# Patient Record
Sex: Female | Born: 1940 | Race: White | Hispanic: No | Smoking: Never smoker
Health system: Southern US, Community
[De-identification: ages and names within clinical notes are randomized; demographics above are authoritative.]

## PROBLEM LIST (undated history)

## (undated) DIAGNOSIS — I340 Nonrheumatic mitral (valve) insufficiency: Secondary | ICD-10-CM

## (undated) DIAGNOSIS — I471 Supraventricular tachycardia, unspecified: Secondary | ICD-10-CM

## (undated) DIAGNOSIS — I10 Essential (primary) hypertension: Secondary | ICD-10-CM

## (undated) DIAGNOSIS — I779 Disorder of arteries and arterioles, unspecified: Secondary | ICD-10-CM

## (undated) DIAGNOSIS — I219 Acute myocardial infarction, unspecified: Secondary | ICD-10-CM

## (undated) DIAGNOSIS — E785 Hyperlipidemia, unspecified: Secondary | ICD-10-CM

## (undated) DIAGNOSIS — C50912 Malignant neoplasm of unspecified site of left female breast: Secondary | ICD-10-CM

## (undated) DIAGNOSIS — I4819 Other persistent atrial fibrillation: Secondary | ICD-10-CM

## (undated) DIAGNOSIS — E119 Type 2 diabetes mellitus without complications: Secondary | ICD-10-CM

## (undated) DIAGNOSIS — I2510 Atherosclerotic heart disease of native coronary artery without angina pectoris: Secondary | ICD-10-CM

## (undated) DIAGNOSIS — J189 Pneumonia, unspecified organism: Secondary | ICD-10-CM

## (undated) DIAGNOSIS — I739 Peripheral vascular disease, unspecified: Secondary | ICD-10-CM

## (undated) HISTORY — DX: Peripheral vascular disease, unspecified: I73.9

## (undated) HISTORY — DX: Disorder of arteries and arterioles, unspecified: I77.9

## (undated) HISTORY — DX: Nonrheumatic mitral (valve) insufficiency: I34.0

## (undated) HISTORY — DX: Other persistent atrial fibrillation: I48.19

## (undated) HISTORY — PX: APPENDECTOMY: SHX54

## (undated) HISTORY — PX: DILATION AND CURETTAGE OF UTERUS: SHX78

---

## 1968-08-10 HISTORY — PX: TUBAL LIGATION: SHX77

## 1996-08-10 HISTORY — PX: ABDOMINAL HYSTERECTOMY: SHX81

## 2002-08-10 DIAGNOSIS — C50912 Malignant neoplasm of unspecified site of left female breast: Secondary | ICD-10-CM

## 2002-08-10 HISTORY — PX: BREAST LUMPECTOMY: SHX2

## 2002-08-10 HISTORY — DX: Malignant neoplasm of unspecified site of left female breast: C50.912

## 2002-08-10 HISTORY — PX: BREAST BIOPSY: SHX20

## 2007-08-11 HISTORY — PX: CORONARY ANGIOPLASTY WITH STENT PLACEMENT: SHX49

## 2015-08-06 ENCOUNTER — Encounter (HOSPITAL_COMMUNITY): Payer: Self-pay | Admitting: Cardiology

## 2015-08-06 ENCOUNTER — Inpatient Hospital Stay (HOSPITAL_COMMUNITY)
Admission: AD | Admit: 2015-08-06 | Discharge: 2015-08-08 | DRG: 247 | Disposition: A | Discharge status: Home / Self Care | Payer: Medicare (Managed Care) | Source: Other Acute Inpatient Hospital | Attending: Cardiology | Admitting: Cardiology

## 2015-08-06 DIAGNOSIS — I4891 Unspecified atrial fibrillation: Secondary | ICD-10-CM

## 2015-08-06 DIAGNOSIS — I471 Supraventricular tachycardia, unspecified: Secondary | ICD-10-CM | POA: Diagnosis present

## 2015-08-06 DIAGNOSIS — N189 Chronic kidney disease, unspecified: Secondary | ICD-10-CM | POA: Diagnosis present

## 2015-08-06 DIAGNOSIS — I2510 Atherosclerotic heart disease of native coronary artery without angina pectoris: Secondary | ICD-10-CM | POA: Diagnosis not present

## 2015-08-06 DIAGNOSIS — I10 Essential (primary) hypertension: Secondary | ICD-10-CM | POA: Diagnosis not present

## 2015-08-06 DIAGNOSIS — Z955 Presence of coronary angioplasty implant and graft: Secondary | ICD-10-CM

## 2015-08-06 DIAGNOSIS — E785 Hyperlipidemia, unspecified: Secondary | ICD-10-CM | POA: Diagnosis present

## 2015-08-06 DIAGNOSIS — I25119 Atherosclerotic heart disease of native coronary artery with unspecified angina pectoris: Secondary | ICD-10-CM | POA: Diagnosis present

## 2015-08-06 DIAGNOSIS — R079 Chest pain, unspecified: Secondary | ICD-10-CM

## 2015-08-06 DIAGNOSIS — I25111 Atherosclerotic heart disease of native coronary artery with angina pectoris with documented spasm: Secondary | ICD-10-CM | POA: Diagnosis not present

## 2015-08-06 DIAGNOSIS — Z7982 Long term (current) use of aspirin: Secondary | ICD-10-CM | POA: Diagnosis not present

## 2015-08-06 DIAGNOSIS — R7989 Other specified abnormal findings of blood chemistry: Secondary | ICD-10-CM

## 2015-08-06 DIAGNOSIS — E119 Type 2 diabetes mellitus without complications: Secondary | ICD-10-CM

## 2015-08-06 DIAGNOSIS — I480 Paroxysmal atrial fibrillation: Secondary | ICD-10-CM | POA: Diagnosis present

## 2015-08-06 DIAGNOSIS — E1122 Type 2 diabetes mellitus with diabetic chronic kidney disease: Secondary | ICD-10-CM | POA: Diagnosis present

## 2015-08-06 DIAGNOSIS — R001 Bradycardia, unspecified: Secondary | ICD-10-CM | POA: Diagnosis present

## 2015-08-06 DIAGNOSIS — I214 Non-ST elevation (NSTEMI) myocardial infarction: Principal | ICD-10-CM

## 2015-08-06 DIAGNOSIS — T82855A Stenosis of coronary artery stent, initial encounter: Secondary | ICD-10-CM | POA: Diagnosis present

## 2015-08-06 DIAGNOSIS — I4819 Other persistent atrial fibrillation: Secondary | ICD-10-CM | POA: Diagnosis present

## 2015-08-06 DIAGNOSIS — I129 Hypertensive chronic kidney disease with stage 1 through stage 4 chronic kidney disease, or unspecified chronic kidney disease: Secondary | ICD-10-CM | POA: Diagnosis present

## 2015-08-06 HISTORY — DX: Hyperlipidemia, unspecified: E78.5

## 2015-08-06 HISTORY — DX: Supraventricular tachycardia: I47.1

## 2015-08-06 HISTORY — DX: Atherosclerotic heart disease of native coronary artery without angina pectoris: I25.10

## 2015-08-06 HISTORY — DX: Supraventricular tachycardia, unspecified: I47.10

## 2015-08-06 HISTORY — DX: Essential (primary) hypertension: I10

## 2015-08-06 HISTORY — DX: Type 2 diabetes mellitus without complications: E11.9

## 2015-08-06 MED ORDER — ATORVASTATIN CALCIUM 40 MG PO TABS
40.0000 mg | ORAL_TABLET | Freq: Every evening | ORAL | Status: DC
  Administered 2015-08-07: 19:00:00 40 mg via ORAL
  Filled 2015-08-06: qty 1

## 2015-08-06 MED ORDER — ONDANSETRON HCL 4 MG/2ML IJ SOLN: 4.0000 mg | Freq: Four times a day (QID) | INTRAMUSCULAR | Status: DC | PRN

## 2015-08-06 MED ORDER — NITROGLYCERIN 0.4 MG SL SUBL: 0.4000 mg | SUBLINGUAL_TABLET | SUBLINGUAL | Status: DC | PRN

## 2015-08-06 MED ORDER — ASPIRIN EC 81 MG PO TBEC
81.0000 mg | DELAYED_RELEASE_TABLET | Freq: Every day | ORAL | Status: DC
  Administered 2015-08-08: 81 mg via ORAL
  Filled 2015-08-06: qty 1

## 2015-08-06 MED ORDER — INSULIN ASPART 100 UNIT/ML ~~LOC~~ SOLN
0.0000 [IU] | Freq: Three times a day (TID) | SUBCUTANEOUS | Status: DC
  Administered 2015-08-07 – 2015-08-08 (×2): 2 [IU] via SUBCUTANEOUS

## 2015-08-06 MED ORDER — ACETAMINOPHEN 325 MG PO TABS: 650.0000 mg | ORAL_TABLET | ORAL | Status: DC | PRN

## 2015-08-06 MED ORDER — ASPIRIN 81 MG PO CHEW: 324.0000 mg | CHEWABLE_TABLET | ORAL | Status: DC

## 2015-08-06 MED ORDER — SODIUM CHLORIDE 0.9 % IJ SOLN
3.0000 mL | Freq: Two times a day (BID) | INTRAMUSCULAR | Status: DC
  Administered 2015-08-07 (×2): 3 mL via INTRAVENOUS

## 2015-08-06 MED ORDER — SODIUM CHLORIDE 0.9 % WEIGHT BASED INFUSION
3.0000 mL/kg/h | INTRAVENOUS | Status: DC
Start: 2015-08-07 — End: 2015-08-07
  Administered 2015-08-07: 3 mL/kg/h via INTRAVENOUS

## 2015-08-06 MED ORDER — METOPROLOL SUCCINATE ER 25 MG PO TB24
25.0000 mg | ORAL_TABLET | Freq: Every morning | ORAL | Status: DC
  Administered 2015-08-07 – 2015-08-08 (×2): 25 mg via ORAL
  Filled 2015-08-06 (×3): qty 1

## 2015-08-06 MED ORDER — SODIUM CHLORIDE 0.9 % WEIGHT BASED INFUSION
1.0000 mL/kg/h | INTRAVENOUS | Status: DC
  Administered 2015-08-07: 1 mL/kg/h via INTRAVENOUS

## 2015-08-06 MED ORDER — ASPIRIN 81 MG PO CHEW
81.0000 mg | CHEWABLE_TABLET | ORAL | Status: AC
  Administered 2015-08-07: 81 mg via ORAL
  Filled 2015-08-06: qty 1

## 2015-08-06 MED ORDER — SODIUM CHLORIDE 0.9 % IJ SOLN: 3.0000 mL | INTRAMUSCULAR | Status: DC | PRN

## 2015-08-06 MED ORDER — ASPIRIN EC 81 MG PO TBEC: 81.0000 mg | DELAYED_RELEASE_TABLET | Freq: Every day | ORAL | Status: DC

## 2015-08-06 MED ORDER — LATANOPROST 0.005 % OP SOLN
1.0000 [drp] | Freq: Every day | OPHTHALMIC | Status: DC
  Administered 2015-08-07: 22:00:00 1 [drp] via OPHTHALMIC
  Filled 2015-08-06 (×2): qty 2.5

## 2015-08-06 MED ORDER — ACETAMINOPHEN 500 MG PO TABS: 1000.0000 mg | ORAL_TABLET | Freq: Four times a day (QID) | ORAL | Status: DC | PRN

## 2015-08-06 MED ORDER — SODIUM CHLORIDE 0.9 % IV SOLN: 250.0000 mL | INTRAVENOUS | Status: DC | PRN

## 2015-08-06 MED ORDER — ASPIRIN 300 MG RE SUPP: 300.0000 mg | RECTAL | Status: DC

## 2015-08-06 NOTE — H&P (Signed)
Admit date: 08/06/2015 Referring Physician: [facility] Primary Cardiologist: None Chief complaint/reason for admission:Chest pain  HPI: This is a 74yo female with a history of DM, HTN, dyslipidemia and prior heart catheterization with LAD DES stent x 2 to the LAD in 2009, who has been having intermittent CP for a few weeks.  She apparently was seen in [place] 2 weeks ago for CP on 07/25/15 for 1 day.  She underwent Chest CT which ruled out PE but could not do cath because kidney function declined after contrast.   She had a stress test the next day that family says was normal but was told that echo was mildly abnormal.  She was supposed to followup with her cardiologist on the 17th of January.   She had reoccurrence of CP starting on Sunday night but yesterday she felt better.  She woke up this am with chest pain that she describes as a tightness that radiated into her jaw and was associated with SOB and palpitations.  She got very dizzy and felt like she was going to pass out.   Today in ER at [facility], her EKG showed SVT at 190bpm and was given IV Adenosine 6mg  and then 12mg  and then given Cardizem bolus of 15mg  and then started on an IV gtt.  Heart rate decreased to low 100's and was noted to be atrial fibrillation.  She subsequently converted to sinus bradycardia at 55bpm with PVC's.  Chest xray showed mild pulmonary vascular congestion.  WBC mildly elevated at 11.5.  Creatinine was 1.14 and glucose 258.  Pro BNP elevated at 8538.  Mag was low at 1.6.  Initial troponin was 0.14.  Her mag was repleted and she was started on IV Heparin gtt. Her Cardizem gtt was stopped due to hypotension and bradycardia post conversion. There were no available beds at [facility] or at [facility] and she was transferred to [facility] for further evaluation and treatment.  Currently she is pain free.     PMH:    Past Medical History  Diagnosis Date  . Hypertension   . Diabetes mellitus without complication ([place])   .  Hyperlipidemia   . Coronary artery disease 2009    PCI of LAD x 2 wth DES in [facility]    PSH:    Past Surgical History  Procedure Laterality Date  . Cardiac catheterization    . Vaginal hysterectomy    . Coronary angioplasty  2009    LAD x 2 with DES    ALLERGIES:   Adhesive; Latex; and Metformin and related  Prior to Admit Meds:   Prescriptions prior to admission  Medication Sig Dispense Refill Last Dose  . acetaminophen (TYLENOL) 500 MG tablet Take 1,000 mg by mouth every 6 (six) hours as needed for moderate pain.   08/05/2015 at Unknown time  . aspirin EC 81 MG tablet Take 81 mg by mouth daily.   08/06/2015 at Unknown time  . atorvastatin (LIPITOR) 40 MG tablet Take 40 mg by mouth every evening.  11 08/05/2015 at Unknown time  . Cinnamon 500 MG TABS Take 1,000 mg by mouth 2 (two) times daily.   08/06/2015 at Unknown time  . metoprolol succinate (TOPROL-XL) 25 MG 24 hr tablet Take 25 mg by mouth every morning.  99 08/06/2015 at 1000  . Travoprost, BAK Free, (TRAVATAN) 0.004 % SOLN ophthalmic solution Place 1 drop into both eyes at bedtime.   08/05/2015 at Unknown time   Family HX:    Family History  Problem Relation Age of Onset  . Ovarian cancer Mother   . CVA Father   . Heart disease Brother    Social HX:    Social History   Social History  . Marital Status: Divorced    Spouse Name: N/A  . Number of Children: N/A  . Years of Education: N/A   Occupational History  . Not on file.   Social History Main Topics  . Smoking status: Never Smoker   . Smokeless tobacco: Never Used  . Alcohol Use: Yes  . Drug Use: No  . Sexual Activity: Not on file   Other Topics Concern  . Not on file   Social History Narrative  . No narrative on file     ROS:  All 11 ROS were addressed and are negative except what is stated in the HPI  PHYSICAL EXAM Filed Vitals:   08/06/15 2106  BP: 148/61  Pulse: 52  Temp: 98.1 F (36.7 C)  Resp: 20   General: Well developed, well  nourished, in no acute distress Head: Eyes PERRLA, No xanthomas.   Normal cephalic and atramatic  Lungs:   Clear bilaterally to auscultation and percussion. Heart:   HRRR S1 S2 Pulses are 2+ & equal.            No carotid bruit. No JVD.  No abdominal bruits. No femoral bruits. Abdomen: Bowel sounds are positive, abdomen soft and non-tender without masses  Extremities:   No clubbing, cyanosis or edema.  DP +1 Neuro: Alert and oriented X 3. Psych:  Good affect, responds appropriately   Labs:  No results found for: WBC, HGB, HCT, MCV, PLT No results for input(s): NA, K, CL, CO2, BUN, CREATININE, CALCIUM, PROT, BILITOT, ALKPHOS, ALT, AST, GLUCOSE in the last 168 hours.  Invalid input(s): LABALBU No results found for: CKTOTAL, CKMB, CKMBINDEX, TROPONINI No results found for: PTT No results found for: INR, PROTIME  No results found for: CHOL No results found for: HDL No results found for: LDLCALC No results found for: TRIG No results found for: CHOLHDL No results found for: LDLDIRECT    Radiology:  No results found.  EKG:  #1 SVT at 190bpm with ST depression in II, V4-V6            #2  Sinus Bradycardia at 55bpm with T wave inversions in V1-V3 new from initial EKG  ASSESSMENT/PLAN: 1.  New onset atrial fibrillation with RVR now in sinus bradycardia after IV Cardizem.  Continue IV Cardizem gtt and BB.  Continue IV Heparin gtt until cath completed and eventually change to NOAC.  This patients CHA2DS2-VASc Score and unadjusted Ischemic Stroke Rate (% per year) is equal to 4 % stroke rate/year from a score of 4.  Above score calculated as 1 point each if present [CHF, HTN, DM, Vascular=MI/PAD/Aortic Plaque, Age if 65-74, or Female].  Above score calculated as 2 points each if present [Age > 75, or Stroke/TIA/TE] 2.  Chest pain - worrisome for ischemia.  Plan cath in am.   3.  Elevated troponin in the setting of afib with RVR.  This could be demand ischemia but she has been having episodic CP  for several weeks.  She  just had a nuclear stress test done 2 weeks ago that was reportedly normal but she could have balanced ischemia.   Cath was initially not pursued due to renal insuff after CT scan of chest.  Her creatinine today is 1.13.  She had ischemic changes on EKG  in lateral precordial leads during afib with RVR and now has new T wave inversions in V1-V3.  She has multiple CRF including DM, HTN, DM and advanced age.  Her Creatinine is stable enough for cath.  Will make NPO for cath in am.  Continue IV Heparin gtt/ASA/statin and BB.  Check 2D echo in am.  Cardiac catheterization was discussed with the patient fully. The patient understands that risks include but are not limited to stroke (1 in 1000), death (1 in 15), kidney failure [usually temporary] (1 in 500), bleeding (1 in 200), allergic reaction [possibly serious] (1 in 200).  The patient understands and is willing to proceed.   4. ASCAD with history of DES to LAD x 2 in 2009 - continue statin/ASA/BB 5.  HTN - continue Toprol 6.  Dyslipidemia - continue atorvastatin and check FLP in am 7.  DM - check HbA1C, qhs BS checks and cover with SS Insulin.    Sueanne Margarita, MD  08/06/2015  11:00 PM

## 2015-08-07 ENCOUNTER — Inpatient Hospital Stay (HOSPITAL_COMMUNITY): Payer: Medicare (Managed Care)

## 2015-08-07 ENCOUNTER — Encounter (HOSPITAL_COMMUNITY)
Admission: AD | Disposition: A | Discharge status: Home / Self Care | Payer: Medicare (Managed Care) | Source: Other Acute Inpatient Hospital | Attending: Cardiology

## 2015-08-07 DIAGNOSIS — I219 Acute myocardial infarction, unspecified: Secondary | ICD-10-CM

## 2015-08-07 DIAGNOSIS — I25119 Atherosclerotic heart disease of native coronary artery with unspecified angina pectoris: Secondary | ICD-10-CM

## 2015-08-07 DIAGNOSIS — R079 Chest pain, unspecified: Secondary | ICD-10-CM

## 2015-08-07 DIAGNOSIS — I2510 Atherosclerotic heart disease of native coronary artery without angina pectoris: Secondary | ICD-10-CM

## 2015-08-07 DIAGNOSIS — I214 Non-ST elevation (NSTEMI) myocardial infarction: Secondary | ICD-10-CM

## 2015-08-07 DIAGNOSIS — I480 Paroxysmal atrial fibrillation: Secondary | ICD-10-CM

## 2015-08-07 HISTORY — PX: CARDIAC CATHETERIZATION: SHX172

## 2015-08-07 HISTORY — DX: Acute myocardial infarction, unspecified: I21.9

## 2015-08-07 LAB — COMPREHENSIVE METABOLIC PANEL
ALT: 32 U/L (ref 14–54)
AST: 19 U/L (ref 15–41)
Albumin: 2.9 g/dL — ABNORMAL LOW (ref 3.5–5.0)
Alkaline Phosphatase: 75 U/L (ref 38–126)
Anion gap: 11 (ref 5–15)
BUN: 15 mg/dL (ref 6–20)
CHLORIDE: 107 mmol/L (ref 101–111)
CO2: 21 mmol/L — AB (ref 22–32)
CREATININE: 1.03 mg/dL — AB (ref 0.44–1.00)
Calcium: 8.5 mg/dL — ABNORMAL LOW (ref 8.9–10.3)
GFR calc Af Amer: >60 mL/min (ref >60)
GFR calc non Af Amer: 53 mL/min — ABNORMAL LOW (ref >60)
Glucose, Bld: 277 mg/dL — ABNORMAL HIGH (ref 65–99)
Potassium: 3.9 mmol/L (ref 3.5–5.1)
SODIUM: 139 mmol/L (ref 135–145)
Total Bilirubin: 0.6 mg/dL (ref 0.3–1.2)
Total Protein: 6.3 g/dL — ABNORMAL LOW (ref 6.5–8.1)

## 2015-08-07 LAB — HEPARIN LEVEL (UNFRACTIONATED)
Heparin Unfractionated: <0.1 IU/mL — ABNORMAL LOW (ref 0.30–0.70)
Heparin Unfractionated: 0.43 [IU]/mL (ref 0.30–0.70)

## 2015-08-07 LAB — CBC WITH DIFFERENTIAL/PLATELET
Basophils Absolute: 0 K/uL (ref 0.0–0.1)
Basophils Relative: 0 %
Eosinophils Absolute: 0.3 K/uL (ref 0.0–0.7)
Eosinophils Relative: 3 %
HCT: 33.4 % — ABNORMAL LOW (ref 36.0–46.0)
Hemoglobin: 10.8 g/dL — ABNORMAL LOW (ref 12.0–15.0)
Lymphocytes Relative: 31 %
Lymphs Abs: 3.2 K/uL (ref 0.7–4.0)
MCH: 28.3 pg (ref 26.0–34.0)
MCHC: 32.3 g/dL (ref 30.0–36.0)
MCV: 87.7 fL (ref 78.0–100.0)
Monocytes Absolute: 0.5 K/uL (ref 0.1–1.0)
Monocytes Relative: 5 %
Neutro Abs: 6.3 K/uL (ref 1.7–7.7)
Neutrophils Relative %: 61 %
Platelets: 261 K/uL (ref 150–400)
RBC: 3.81 MIL/uL — ABNORMAL LOW (ref 3.87–5.11)
RDW: 13.4 % (ref 11.5–15.5)
WBC: 10.4 K/uL (ref 4.0–10.5)

## 2015-08-07 LAB — APTT: aPTT: 43 seconds — ABNORMAL HIGH (ref 24–37)

## 2015-08-07 LAB — GLUCOSE, CAPILLARY
GLUCOSE-CAPILLARY: 130 mg/dL — AB (ref 65–99)
GLUCOSE-CAPILLARY: 90 mg/dL (ref 65–99)
GLUCOSE-CAPILLARY: 185 mg/dL — AB (ref 65–99)
Glucose-Capillary: 177 mg/dL — ABNORMAL HIGH (ref 65–99)

## 2015-08-07 LAB — POCT ACTIVATED CLOTTING TIME
Activated Clotting Time: 276 seconds
Activated Clotting Time: 652 seconds

## 2015-08-07 LAB — MAGNESIUM: Magnesium: 1.9 mg/dL (ref 1.7–2.4)

## 2015-08-07 LAB — TROPONIN I
Troponin I: 0.26 ng/mL — ABNORMAL HIGH
Troponin I: 0.18 ng/mL — ABNORMAL HIGH
Troponin I: 0.14 ng/mL — ABNORMAL HIGH (ref <0.031)

## 2015-08-07 LAB — TSH: TSH: 5.199 u[IU]/mL — ABNORMAL HIGH (ref 0.350–4.500)

## 2015-08-07 LAB — PROTIME-INR
INR: 1.26 (ref 0.00–1.49)
PROTHROMBIN TIME: 15.9 s — AB (ref 11.6–15.2)

## 2015-08-07 LAB — OCCULT BLOOD X 1 CARD TO LAB, STOOL: FECAL OCCULT BLD: NEGATIVE

## 2015-08-07 SURGERY — LEFT HEART CATH AND CORONARY ANGIOGRAPHY
Anesthesia: LOCAL

## 2015-08-07 MED ORDER — SODIUM CHLORIDE 0.9 % IV SOLN: INTRAVENOUS | Status: AC

## 2015-08-07 MED ORDER — HEPARIN (PORCINE) IN NACL 2-0.9 UNIT/ML-% IJ SOLN
INTRAMUSCULAR | Status: AC
  Filled 2015-08-07: qty 1000

## 2015-08-07 MED ORDER — SODIUM CHLORIDE 0.9 % IJ SOLN: 3.0000 mL | INTRAMUSCULAR | Status: DC | PRN

## 2015-08-07 MED ORDER — HEART ATTACK BOUNCING BOOK
Freq: Once | Status: AC
  Administered 2015-08-07: 21:00:00
  Filled 2015-08-07: qty 1

## 2015-08-07 MED ORDER — NITROGLYCERIN 1 MG/10 ML FOR IR/CATH LAB
INTRA_ARTERIAL | Status: AC
  Filled 2015-08-07: qty 10

## 2015-08-07 MED ORDER — HEPARIN (PORCINE) IN NACL 2-0.9 UNIT/ML-% IJ SOLN
INTRAMUSCULAR | Status: DC | PRN
  Administered 2015-08-07: 17:00:00

## 2015-08-07 MED ORDER — MIDAZOLAM HCL 2 MG/2ML IJ SOLN
INTRAMUSCULAR | Status: AC
  Filled 2015-08-07: qty 2

## 2015-08-07 MED ORDER — MIDAZOLAM HCL 2 MG/2ML IJ SOLN
INTRAMUSCULAR | Status: DC | PRN
  Administered 2015-08-07: 2 mg via INTRAVENOUS

## 2015-08-07 MED ORDER — SODIUM CHLORIDE 0.9 % IV SOLN: 250.0000 mL | INTRAVENOUS | Status: DC | PRN

## 2015-08-07 MED ORDER — CLOPIDOGREL BISULFATE 300 MG PO TABS
ORAL_TABLET | ORAL | Status: DC | PRN
  Administered 2015-08-07: 600 mg via ORAL

## 2015-08-07 MED ORDER — IOHEXOL 350 MG/ML SOLN
INTRAVENOUS | Status: DC | PRN
  Administered 2015-08-07: 145 mL via INTRA_ARTERIAL

## 2015-08-07 MED ORDER — SODIUM CHLORIDE 0.9 % IJ SOLN: 3.0000 mL | Freq: Two times a day (BID) | INTRAMUSCULAR | Status: DC

## 2015-08-07 MED ORDER — CLOPIDOGREL BISULFATE 300 MG PO TABS
ORAL_TABLET | ORAL | Status: AC
  Filled 2015-08-07: qty 2

## 2015-08-07 MED ORDER — VERAPAMIL HCL 2.5 MG/ML IV SOLN
INTRAVENOUS | Status: DC | PRN
  Administered 2015-08-07: 16:00:00 via INTRA_ARTERIAL

## 2015-08-07 MED ORDER — CLOPIDOGREL BISULFATE 75 MG PO TABS
75.0000 mg | ORAL_TABLET | Freq: Every day | ORAL | Status: DC
  Administered 2015-08-08: 75 mg via ORAL
  Filled 2015-08-07: qty 1

## 2015-08-07 MED ORDER — HEPARIN (PORCINE) IN NACL 100-0.45 UNIT/ML-% IJ SOLN
1150.0000 [IU]/h | INTRAMUSCULAR | Status: AC
  Administered 2015-08-08: 01:00:00 1150 [IU]/h via INTRAVENOUS
  Filled 2015-08-07: qty 250

## 2015-08-07 MED ORDER — ANGIOPLASTY BOOK
Freq: Once | Status: AC
  Administered 2015-08-07: 21:00:00
  Filled 2015-08-07: qty 1

## 2015-08-07 MED ORDER — LIDOCAINE HCL (PF) 1 % IJ SOLN
INTRAMUSCULAR | Status: AC
  Filled 2015-08-07: qty 30

## 2015-08-07 MED ORDER — HEPARIN SODIUM (PORCINE) 1000 UNIT/ML IJ SOLN
INTRAMUSCULAR | Status: DC | PRN
  Administered 2015-08-07: 5500 [IU] via INTRAVENOUS
  Administered 2015-08-07: 2500 [IU] via INTRAVENOUS
  Administered 2015-08-07: 3500 [IU] via INTRAVENOUS

## 2015-08-07 MED ORDER — FENTANYL CITRATE (PF) 100 MCG/2ML IJ SOLN
INTRAMUSCULAR | Status: AC
  Filled 2015-08-07: qty 2

## 2015-08-07 MED ORDER — HEPARIN (PORCINE) IN NACL 2-0.9 UNIT/ML-% IJ SOLN
INTRAMUSCULAR | Status: AC
  Filled 2015-08-07: qty 1500

## 2015-08-07 MED ORDER — HEPARIN SODIUM (PORCINE) 1000 UNIT/ML IJ SOLN
INTRAMUSCULAR | Status: AC
  Filled 2015-08-07: qty 1

## 2015-08-07 MED ORDER — FENTANYL CITRATE (PF) 100 MCG/2ML IJ SOLN
INTRAMUSCULAR | Status: DC | PRN
  Administered 2015-08-07: 25 ug via INTRAVENOUS

## 2015-08-07 MED ORDER — HEPARIN (PORCINE) IN NACL 100-0.45 UNIT/ML-% IJ SOLN
1150.0000 [IU]/h | INTRAMUSCULAR | Status: DC
  Administered 2015-08-07: 850 [IU]/h via INTRAVENOUS

## 2015-08-07 MED ORDER — HEPARIN BOLUS VIA INFUSION
2000.0000 [IU] | Freq: Once | INTRAVENOUS | Status: AC
  Administered 2015-08-07: 2000 [IU] via INTRAVENOUS
  Filled 2015-08-07: qty 2000

## 2015-08-07 MED ORDER — VERAPAMIL HCL 2.5 MG/ML IV SOLN
INTRAVENOUS | Status: AC
  Filled 2015-08-07: qty 2

## 2015-08-07 MED ORDER — OFF THE BEAT BOOK
Freq: Once | Status: AC
  Administered 2015-08-07: 09:00:00
  Filled 2015-08-07: qty 1

## 2015-08-07 MED ORDER — NITROGLYCERIN 1 MG/10 ML FOR IR/CATH LAB
INTRA_ARTERIAL | Status: DC | PRN
  Administered 2015-08-07: 200 ug via INTRACORONARY

## 2015-08-07 MED ORDER — HYDRALAZINE HCL 20 MG/ML IJ SOLN
10.0000 mg | INTRAMUSCULAR | Status: DC | PRN
  Administered 2015-08-07: 10 mg via INTRAVENOUS
  Filled 2015-08-07: qty 1

## 2015-08-07 SURGICAL SUPPLY — 19 items
BALLN EUPHORA RX 2.0X15 (BALLOONS) ×2
BALLN ~~LOC~~ EMERGE MR 2.25X15 (BALLOONS) ×2
BALLOON EUPHORA RX 2.0X15 (BALLOONS) ×1 IMPLANT
BALLOON ~~LOC~~ EMERGE MR 2.25X15 (BALLOONS) ×1 IMPLANT
CATH INFINITI 5 FR JL3.5 (CATHETERS) ×2 IMPLANT
CATH INFINITI 5FR ANG PIGTAIL (CATHETERS) ×2 IMPLANT
CATH INFINITI JR4 5F (CATHETERS) ×2 IMPLANT
CATH VISTA GUIDE 6FR XBLAD3.5 (CATHETERS) ×2 IMPLANT
DEVICE RAD COMP TR BAND LRG (VASCULAR PRODUCTS) ×2 IMPLANT
GLIDESHEATH SLEND SS 6F .021 (SHEATH) ×2 IMPLANT
KIT ENCORE 26 ADVANTAGE (KITS) ×2 IMPLANT
KIT HEART LEFT (KITS) ×2 IMPLANT
PACK CARDIAC CATHETERIZATION (CUSTOM PROCEDURE TRAY) ×2 IMPLANT
STENT PROMUS PREM MR 2.25X20 (Permanent Stent) ×2 IMPLANT
SYR MEDRAD MARK V 150ML (SYRINGE) ×2 IMPLANT
TRANSDUCER W/STOPCOCK (MISCELLANEOUS) ×2 IMPLANT
TUBING CIL FLEX 10 FLL-RA (TUBING) ×2 IMPLANT
WIRE COUGAR XT STRL 190CM (WIRE) ×2 IMPLANT
WIRE SAFE-T 1.5MM-J .035X260CM (WIRE) ×2 IMPLANT

## 2015-08-07 NOTE — Progress Notes (Signed)
ANTICOAGULATION CONSULT NOTE - Initial Consult  Pharmacy Consult for Heparin Indication: chest pain/ACS  Allergies  Allergen Reactions  . Adhesive [Tape] Other (See Comments)    BLISTERING  . Latex Other (See Comments)    BLISTERING  . Metformin And Related     Patient Measurements: Height: 5\' 5"  (165.1 cm) Weight: 153 lb 4.8 oz (69.536 kg) IBW/kg (Calculated) : 57  Heparin dosing wt: 69 kg  Vital Signs: Temp: 98.1 F (36.7 C) (12/27 2106) Temp Source: Oral (12/27 2106) BP: 148/61 mmHg (12/27 2106) Pulse Rate: 52 (12/27 2106)  Labs (at OSH) : WBC  11.5 Hgb  12.2 Hct  37.1 Plt  278  SCr  1.14  Recent Labs  08/07/15 0102 08/07/15 0103  HGB  --  10.8*  HCT  --  33.4*  PLT  --  261  APTT  --  43*  LABPROT  --  15.9*  INR  --  1.26  HEPARINUNFRC <0.10*  --   CREATININE  --  1.03*  TROPONINI  --  0.26*    Estimated Creatinine Clearance: 48.3 mL/min (by C-G formula based on Cr of 1.03).   Medical History: Past Medical History  Diagnosis Date  . Hypertension   . Diabetes mellitus without complication ([place])   . Hyperlipidemia   . Coronary artery disease 2009    PCI of LAD x 2 wth DES in [facility]    Medications:  Lipitor  Toprol XL  ASA  Ntg    Assessment: 74 y.o. female with chest pain for heparin.  Heparin 4080 units IV bolus, 850 units/hr started at [facility] at 2 pm. Heparin level undetectable on this rate. No issues with line or bleeding reported per RN.  Goal of Therapy:  Heparin level 0.3-0.7 units/ml Monitor platelets by anticoagulation protocol: Yes   Plan:  Rebolus heparin 2000 units Increase heparin gtt to 1150 units/hr F/u 8 hr heparin level Daily heparin level and CBC  Sherlon Handing, PharmD, BCPS Clinical pharmacist, pager [phone] 08/07/2015,3:32 AM

## 2015-08-07 NOTE — Care Management Note (Addendum)
Case Management Note  Patient Details  Name: Brandi Frey MRN: [id number] Date of Birth: 26-Dec-1940  Subjective/Objective:  Pt admitted for SVT positive cardiac enzymes. Plan for cardiac cath 08-07-15.                   Action/Plan: CM will continue to monitor for disposition needs.    Expected Discharge Date:                  Expected Discharge Plan:  Home/Self Care  In-House Referral:     Discharge planning Services  CM Consult  Post Acute Care Choice:    Choice offered to:     DME Arranged:    DME Agency:     HH Arranged:    HH Agency:     Status of Service:  In process, will continue to follow  Medicare Important Message Given:    Date Medicare IM Given:    Medicare IM give by:    Date Additional Medicare IM Given:    Additional Medicare Important Message give by:     If discussed at [place] of Stay Meetings, dates discussed:    Additional Comments:  Bethena Roys, RN 08/07/2015, 10:43 AM

## 2015-08-07 NOTE — Progress Notes (Signed)
[place] for Heparin Indication: chest pain/ACS  Allergies  Allergen Reactions  . Adhesive [Tape] Other (See Comments)    BLISTERING  . Latex Other (See Comments)    BLISTERING  . Metformin And Related     Patient Measurements: Height: 5\' 5"  (165.1 cm) Weight: 153 lb (69.4 kg) IBW/kg (Calculated) : 57  Heparin dosing wt: 69 kg  Vital Signs: Temp: 98.1 F (36.7 C) (12/28 1339) Temp Source: Oral (12/28 1339) BP: 159/72 mmHg (12/28 1640) Pulse Rate: 65 (12/28 1640)  Labs (at OSH) : WBC  11.5 Hgb  12.2 Hct  37.1 Plt  278  SCr  1.14  Recent Labs  08/07/15 0102 08/07/15 0103 08/07/15 0605 08/07/15 1212  HGB  --  10.8*  --   --   HCT  --  33.4*  --   --   PLT  --  261  --   --   APTT  --  43*  --   --   LABPROT  --  15.9*  --   --   INR  --  1.26  --   --   HEPARINUNFRC <0.10*  --   --  0.43  CREATININE  --  1.03*  --   --   TROPONINI  --  0.26* 0.18* 0.14*    Estimated Creatinine Clearance: 48.3 mL/min (by C-G formula based on Cr of 1.03).   Medical History: Past Medical History  Diagnosis Date  . Hypertension   . Diabetes mellitus without complication ([place])   . Hyperlipidemia   . Coronary artery disease 2009    PCI of LAD x 2 wth DES in [facility]       Assessment: 74 y.o. female with chest pain for heparin. She is now s/p cath with stent to the LAD. She is also noted with afib (CHADSVASC= 5) and to restart heparin post cath (8 hrs post sheath removal; sheath removed ~4:50pm and TR band placed). Post cath recommendations per cardiology- if long term anticoagulation stop aspirin.   Goal of Therapy:  Heparin level 0.3-0.7 units/ml Monitor platelets by anticoagulation protocol: Yes   Plan:  -Restart heparin at 1150 units/hr at 0100 on 08/08/15 -Heparin level in 8 hours and daily wth CBC daily -Will follow anticoagulation plans  Hildred Laser, Pharm D 08/07/2015 5:18 PM

## 2015-08-07 NOTE — Progress Notes (Signed)
ANTICOAGULATION CONSULT NOTE - Initial Consult  Pharmacy Consult for Heparin Indication: chest pain/ACS  Allergies  Allergen Reactions  . Adhesive [Tape] Other (See Comments)    BLISTERING  . Latex Other (See Comments)    BLISTERING  . Metformin And Related     Patient Measurements: Height: 5\' 5"  (165.1 cm) Weight: 153 lb (69.4 kg) IBW/kg (Calculated) : 57  Heparin dosing wt: 69 kg  Vital Signs: Temp: 98.2 F (36.8 C) (12/28 0441) Temp Source: Oral (12/28 0441) BP: 123/52 mmHg (12/28 0441) Pulse Rate: 58 (12/28 0441)  Labs (at OSH) : WBC  11.5 Hgb  12.2 Hct  37.1 Plt  278  SCr  1.14  Recent Labs  08/07/15 0102 08/07/15 0103 08/07/15 0605 08/07/15 1212  HGB  --  10.8*  --   --   HCT  --  33.4*  --   --   PLT  --  261  --   --   APTT  --  43*  --   --   LABPROT  --  15.9*  --   --   INR  --  1.26  --   --   HEPARINUNFRC <0.10*  --   --  0.43  CREATININE  --  1.03*  --   --   TROPONINI  --  0.26* 0.18*  --     Estimated Creatinine Clearance: 48.3 mL/min (by C-G formula based on Cr of 1.03).   Medical History: Past Medical History  Diagnosis Date  . Hypertension   . Diabetes mellitus without complication ([place])   . Hyperlipidemia   . Coronary artery disease 2009    PCI of LAD x 2 wth DES in [facility]    Medications:  Lipitor  Toprol XL  ASA  Ntg    Assessment: 74 y.o. female with chest pain for heparin.  Heparin 4080 units IV bolus, 850 units/hr started at [facility] at 2 pm. Heparin level undetectable on this rate. Heparin level is therapeutic right now. Cath today.   Goal of Therapy:  Heparin level 0.3-0.7 units/ml Monitor platelets by anticoagulation protocol: Yes   Plan:   Cont heparin gtt 1150 units/hr F/u after cath Daily heparin level and CBC

## 2015-08-07 NOTE — Interval H&P Note (Signed)
History and Physical Interval Note:  08/07/2015 3:34 PM  Brandi Frey  has presented today for cardiac cath with the diagnosis of elevated troponin, unstable angina. The various methods of treatment have been discussed with the patient and family. After consideration of risks, benefits and other options for treatment, the patient has consented to  Procedure(s): Left Heart Cath and Coronary Angiography (N/A) as a surgical intervention .  The patient's history has been reviewed, patient examined, no change in status, stable for surgery.  I have reviewed the patient's chart and labs.  Questions were answered to the patient's satisfaction.    Cath Lab Visit (complete for each Cath Lab visit)  Clinical Evaluation Leading to the Procedure:   ACS: Yes.    Non-ACS:    Anginal Classification: CCS III  Anti-ischemic medical therapy: Minimal Therapy (1 class of medications)  Non-Invasive Test Results: No non-invasive testing performed  Prior CABG: No previous CABG        MCALHANY,CHRISTOPHER

## 2015-08-07 NOTE — Progress Notes (Addendum)
UR Completed Jujuan Dugo Graves-Bigelow, RN,BSN [phone]  

## 2015-08-07 NOTE — H&P (View-Only) (Signed)
PROGRESS NOTE  Subjective:   74 yo , from [place].  Hx of CAD, DM, HTN  Transferred from [facility] with SVT . Atrial fib with RVR and CP Troponin levels are mildly elevated.   Feeling better  She is currently back in sinus rhythm. Objective:    Vital Signs:   Temp:  [98.1 F (36.7 C)-98.2 F (36.8 C)] 98.2 F (36.8 C) (12/28 0441) Pulse Rate:  [52-58] 58 (12/28 0441) Resp:  [16-20] 16 (12/28 0441) BP: (123-148)/(52-61) 123/52 mmHg (12/28 0441) SpO2:  [99 %-100 %] 99 % (12/28 0441) Weight:  [153 lb (69.4 kg)-153 lb 4.8 oz (69.536 kg)] 153 lb (69.4 kg) (12/28 0441)  Last BM Date: 08/06/15   24-hour weight change: Weight change:   Weight trends: Filed Weights   08/06/15 2106 08/07/15 0441  Weight: 153 lb 4.8 oz (69.536 kg) 153 lb (69.4 kg)    Intake/Output:    Total I/O In: 420 [P.O.:420] Out: -    Physical Exam: BP 123/52 mmHg  Pulse 58  Temp(Src) 98.2 F (36.8 C) (Oral)  Resp 16  Ht 5\' 5"  (1.651 m)  Wt 153 lb (69.4 kg)  BMI 25.46 kg/m2  SpO2 99%  Wt Readings from Last 3 Encounters:  08/07/15 153 lb (69.4 kg)    General: Vital signs reviewed and noted.   Head: Normocephalic, atraumatic.  Eyes: conjunctivae/corneas clear.  EOM's intact.   Throat: normal  Neck:  normal  Lungs:    clear   Heart:  RR   Abdomen:  Soft, non-tender, non-distended    Extremities: No edema .  Good pulses.    Neurologic: A&O X3, CN II - XII are grossly intact.   Psych: Normal     Labs: BMET:  Recent Labs  08/07/15 0103  NA 139  K 3.9  CL 107  CO2 21*  GLUCOSE 277*  BUN 15  CREATININE 1.03*  CALCIUM 8.5*  MG 1.9    Liver function tests:  Recent Labs  08/07/15 0103  AST 19  ALT 32  ALKPHOS 75  BILITOT 0.6  PROT 6.3*  ALBUMIN 2.9*   No results for input(s): LIPASE, AMYLASE in the last 72 hours.  CBC:  Recent Labs  08/07/15 0103  WBC 10.4  NEUTROABS 6.3  HGB 10.8*  HCT 33.4*  MCV 87.7  PLT 261    Cardiac Enzymes:  Recent Labs  08/07/15 0103 08/07/15 0605  TROPONINI 0.26* 0.18*    Coagulation Studies:  Recent Labs  08/07/15 0103  LABPROT 15.9*  INR 1.26    Other: Invalid input(s): POCBNP No results for input(s): DDIMER in the last 72 hours. No results for input(s): HGBA1C in the last 72 hours. No results for input(s): CHOL, HDL, LDLCALC, TRIG, CHOLHDL in the last 72 hours.  Recent Labs  08/07/15 0103  TSH 5.199*   No results for input(s): VITAMINB12, FOLATE, FERRITIN, TIBC, IRON, RETICCTPCT in the last 72 hours.   Other results:  Tele  ( personally reviewed )  -   Sinus brady at 58  Medications:    Infusions: . sodium chloride 1 mL/kg/hr (08/07/15 0830)  . heparin 1,150 Units/hr (08/07/15 0342)    Scheduled Medications: . aspirin  324 mg Oral NOW   Or  . aspirin  300 mg Rectal NOW  . [START ON 08/08/2015] aspirin EC  81 mg Oral Daily  . atorvastatin  40 mg Oral QPM  . insulin aspart  0-9 Units Subcutaneous TID WC  . latanoprost  1 drop Both Eyes QHS  . metoprolol succinate  25 mg Oral q morning - 10a  . off the beat book   Does not apply Once  . sodium chloride  3 mL Intravenous Q12H    Assessment/ Plan:   Active Problems:   Coronary artery disease   Chest pain  1. Coronary artery disease: This is presents with episodes of chest discomfort that are consistent with angina. She has a history of coronary artery disease with stenting in the past. We'll proceed with cardiac catheterization today. We discussed risks, benefits, and options. She understands and agrees to proceed.  2. Atrial fibrillation: The patient presented to [facility] with supraventricular tachycardia which was treated with adenosine. She then developed atrial fibrillation with rapid ventricular response.  She has now converted with IV diltiazem.  She has had a CHADS2VASC score of 4 and will need anticoagulation at some point.         Disposition:  Length of Stay: 1  Thayer Headings, Brooke Bonito., MD,  [facility] 08/07/2015, 9:27 AM Office [phone] Pager [phone]

## 2015-08-07 NOTE — Progress Notes (Signed)
TR BAND REMOVAL  LOCATION:    right radial  DEFLATED PER PROTOCOL:    Yes.    TIME BAND OFF / DRESSING APPLIED:    22:30   SITE UPON ARRIVAL:    Level 1  SITE AFTER BAND REMOVAL:    Level 1  CIRCULATION SENSATION AND MOVEMENT:    Within Normal Limits   Yes.    COMMENTS:  Bruise noted, no hematoma

## 2015-08-07 NOTE — Plan of Care (Signed)
Problem: Consults Goal: Cardiac Cath Patient Education (See Patient Education module for education specifics.) Outcome: Completed/Met Date Met:  08/07/15 Pt educated on cardiac catheterization with possible percutaneous coronary intervention. Pt states they have a good understaning of the procedure, understands the risks and benefits. Has has 2 stents in the past and does not wish to watch the cardiac cath video.

## 2015-08-07 NOTE — Progress Notes (Signed)
PROGRESS NOTE  Subjective:   74 yo , from [place].  Hx of CAD, DM, HTN  Transferred from [facility] with SVT . Atrial fib with RVR and CP Troponin levels are mildly elevated.   Feeling better  She is currently back in sinus rhythm. Objective:    Vital Signs:   Temp:  [98.1 F (36.7 C)-98.2 F (36.8 C)] 98.2 F (36.8 C) (12/28 0441) Pulse Rate:  [52-58] 58 (12/28 0441) Resp:  [16-20] 16 (12/28 0441) BP: (123-148)/(52-61) 123/52 mmHg (12/28 0441) SpO2:  [99 %-100 %] 99 % (12/28 0441) Weight:  [153 lb (69.4 kg)-153 lb 4.8 oz (69.536 kg)] 153 lb (69.4 kg) (12/28 0441)  Last BM Date: 08/06/15   24-hour weight change: Weight change:   Weight trends: Filed Weights   08/06/15 2106 08/07/15 0441  Weight: 153 lb 4.8 oz (69.536 kg) 153 lb (69.4 kg)    Intake/Output:    Total I/O In: 420 [P.O.:420] Out: -    Physical Exam: BP 123/52 mmHg  Pulse 58  Temp(Src) 98.2 F (36.8 C) (Oral)  Resp 16  Ht 5\' 5"  (1.651 m)  Wt 153 lb (69.4 kg)  BMI 25.46 kg/m2  SpO2 99%  Wt Readings from Last 3 Encounters:  08/07/15 153 lb (69.4 kg)    General: Vital signs reviewed and noted.   Head: Normocephalic, atraumatic.  Eyes: conjunctivae/corneas clear.  EOM's intact.   Throat: normal  Neck:  normal  Lungs:    clear   Heart:  RR   Abdomen:  Soft, non-tender, non-distended    Extremities: No edema .  Good pulses.    Neurologic: A&O X3, CN II - XII are grossly intact.   Psych: Normal     Labs: BMET:  Recent Labs  08/07/15 0103  NA 139  K 3.9  CL 107  CO2 21*  GLUCOSE 277*  BUN 15  CREATININE 1.03*  CALCIUM 8.5*  MG 1.9    Liver function tests:  Recent Labs  08/07/15 0103  AST 19  ALT 32  ALKPHOS 75  BILITOT 0.6  PROT 6.3*  ALBUMIN 2.9*   No results for input(s): LIPASE, AMYLASE in the last 72 hours.  CBC:  Recent Labs  08/07/15 0103  WBC 10.4  NEUTROABS 6.3  HGB 10.8*  HCT 33.4*  MCV 87.7  PLT 261    Cardiac Enzymes:  Recent Labs  08/07/15 0103 08/07/15 0605  TROPONINI 0.26* 0.18*    Coagulation Studies:  Recent Labs  08/07/15 0103  LABPROT 15.9*  INR 1.26    Other: Invalid input(s): POCBNP No results for input(s): DDIMER in the last 72 hours. No results for input(s): HGBA1C in the last 72 hours. No results for input(s): CHOL, HDL, LDLCALC, TRIG, CHOLHDL in the last 72 hours.  Recent Labs  08/07/15 0103  TSH 5.199*   No results for input(s): VITAMINB12, FOLATE, FERRITIN, TIBC, IRON, RETICCTPCT in the last 72 hours.   Other results:  Tele  ( personally reviewed )  -   Sinus brady at 58  Medications:    Infusions: . sodium chloride 1 mL/kg/hr (08/07/15 0830)  . heparin 1,150 Units/hr (08/07/15 0342)    Scheduled Medications: . aspirin  324 mg Oral NOW   Or  . aspirin  300 mg Rectal NOW  . [START ON 08/08/2015] aspirin EC  81 mg Oral Daily  . atorvastatin  40 mg Oral QPM  . insulin aspart  0-9 Units Subcutaneous TID WC  . latanoprost  1 drop Both Eyes QHS  . metoprolol succinate  25 mg Oral q morning - 10a  . off the beat book   Does not apply Once  . sodium chloride  3 mL Intravenous Q12H    Assessment/ Plan:   Active Problems:   Coronary artery disease   Chest pain  1. Coronary artery disease: This is presents with episodes of chest discomfort that are consistent with angina. She has a history of coronary artery disease with stenting in the past. We'll proceed with cardiac catheterization today. We discussed risks, benefits, and options. She understands and agrees to proceed.  2. Atrial fibrillation: The patient presented to [facility] with supraventricular tachycardia which was treated with adenosine. She then developed atrial fibrillation with rapid ventricular response.  She has now converted with IV diltiazem.  She has had a CHADS2VASC score of 4 and will need anticoagulation at some point.         Disposition:  Length of Stay: 1  Thayer Headings, Brooke Bonito., MD,  [facility] 08/07/2015, 9:27 AM Office [phone] Pager [phone]

## 2015-08-08 ENCOUNTER — Telehealth: Payer: Self-pay

## 2015-08-08 ENCOUNTER — Encounter (HOSPITAL_COMMUNITY): Payer: Self-pay | Admitting: Cardiovascular Disease

## 2015-08-08 ENCOUNTER — Telehealth: Payer: Self-pay | Admitting: Physician Assistant

## 2015-08-08 DIAGNOSIS — E119 Type 2 diabetes mellitus without complications: Secondary | ICD-10-CM

## 2015-08-08 DIAGNOSIS — E785 Hyperlipidemia, unspecified: Secondary | ICD-10-CM | POA: Diagnosis present

## 2015-08-08 DIAGNOSIS — I25111 Atherosclerotic heart disease of native coronary artery with angina pectoris with documented spasm: Secondary | ICD-10-CM

## 2015-08-08 DIAGNOSIS — I2510 Atherosclerotic heart disease of native coronary artery without angina pectoris: Secondary | ICD-10-CM | POA: Diagnosis present

## 2015-08-08 DIAGNOSIS — I10 Essential (primary) hypertension: Secondary | ICD-10-CM | POA: Diagnosis present

## 2015-08-08 DIAGNOSIS — I471 Supraventricular tachycardia, unspecified: Secondary | ICD-10-CM | POA: Diagnosis present

## 2015-08-08 DIAGNOSIS — I4819 Other persistent atrial fibrillation: Secondary | ICD-10-CM | POA: Diagnosis present

## 2015-08-08 DIAGNOSIS — I214 Non-ST elevation (NSTEMI) myocardial infarction: Principal | ICD-10-CM

## 2015-08-08 LAB — BASIC METABOLIC PANEL
Anion gap: 7 (ref 5–15)
BUN: 10 mg/dL (ref 6–20)
CALCIUM: 8.8 mg/dL — AB (ref 8.9–10.3)
CO2: 22 mmol/L (ref 22–32)
CREATININE: 0.79 mg/dL (ref 0.44–1.00)
Chloride: 110 mmol/L (ref 101–111)
GFR calc non Af Amer: >60 mL/min (ref >60)
Glucose, Bld: 153 mg/dL — ABNORMAL HIGH (ref 65–99)
Potassium: 4 mmol/L (ref 3.5–5.1)
Sodium: 139 mmol/L (ref 135–145)

## 2015-08-08 LAB — CBC
HEMATOCRIT: 35 % — AB (ref 36.0–46.0)
Hemoglobin: 11.5 g/dL — ABNORMAL LOW (ref 12.0–15.0)
MCH: 28.4 pg (ref 26.0–34.0)
MCHC: 32.9 g/dL (ref 30.0–36.0)
MCV: 86.4 fL (ref 78.0–100.0)
Platelets: 284 10*3/uL (ref 150–400)
RBC: 4.05 MIL/uL (ref 3.87–5.11)
RDW: 13.2 % (ref 11.5–15.5)
WBC: 9.3 10*3/uL (ref 4.0–10.5)

## 2015-08-08 LAB — GLUCOSE, CAPILLARY: GLUCOSE-CAPILLARY: 156 mg/dL — AB (ref 65–99)

## 2015-08-08 LAB — HEMOGLOBIN A1C
Hgb A1c MFr Bld: 8.1 % — ABNORMAL HIGH (ref 4.8–5.6)
MEAN PLASMA GLUCOSE: 186 mg/dL

## 2015-08-08 MED ORDER — APIXABAN 5 MG PO TABS
5.0000 mg | ORAL_TABLET | Freq: Two times a day (BID) | ORAL | Status: DC
  Administered 2015-08-08: 08:00:00 5 mg via ORAL
  Filled 2015-08-08: qty 1

## 2015-08-08 MED ORDER — CLOPIDOGREL BISULFATE 75 MG PO TABS: 75.0000 mg | ORAL_TABLET | Freq: Every day | ORAL | Status: DC

## 2015-08-08 MED ORDER — NITROGLYCERIN 0.4 MG SL SUBL: 0.4000 mg | SUBLINGUAL_TABLET | SUBLINGUAL | Status: DC | PRN

## 2015-08-08 MED ORDER — METFORMIN HCL 500 MG PO TABS: 500.0000 mg | ORAL_TABLET | Freq: Every day | ORAL | Status: DC

## 2015-08-08 MED ORDER — ATORVASTATIN CALCIUM 80 MG PO TABS: 40.0000 mg | ORAL_TABLET | Freq: Every evening | ORAL | Status: DC

## 2015-08-08 MED ORDER — APIXABAN 5 MG PO TABS: 5.0000 mg | ORAL_TABLET | Freq: Two times a day (BID) | ORAL | Status: DC

## 2015-08-08 MED ORDER — ATORVASTATIN CALCIUM 80 MG PO TABS: 80.0000 mg | ORAL_TABLET | Freq: Every evening | ORAL | Status: DC

## 2015-08-08 MED FILL — Nitroglycerin IV Soln 100 MCG/ML in D5W: INTRA_ARTERIAL | Qty: 10 | Status: AC

## 2015-08-08 MED FILL — Heparin Sodium (Porcine) 2 Unit/ML in Sodium Chloride 0.9%: INTRAMUSCULAR | Qty: 500 | Status: AC

## 2015-08-08 NOTE — Telephone Encounter (Signed)
Approval for Eliquis 5mg  through 08/07/2016. PA # [id number].

## 2015-08-08 NOTE — Progress Notes (Signed)
PROGRESS NOTE  Subjective:   74 yo , from [place].  Hx of CAD, DM, HTN  Transferred from [facility] with SVT . Atrial fib with RVR and CP Troponin levels are mildly elevated.   Feeling better  She is currently back in sinus rhythm.   She had PCI of her mid LAD yesterday  2.25 x 20 mm Promus Premier DES    Objective:    Vital Signs:   Temp:  [98 F (36.7 C)-98.4 F (36.9 C)] 98.3 F (36.8 C) (12/29 0241) Pulse Rate:  [0-72] 61 (12/29 0241) Resp:  [13-115] 19 (12/29 0241) BP: (128-185)/(43-72) 138/43 mmHg (12/29 0241) SpO2:  [0 %-100 %] 94 % (12/29 0241) Weight:  [152 lb 12.5 oz (69.3 kg)] 152 lb 12.5 oz (69.3 kg) (12/29 0241)  Last BM Date: 08/07/15   24-hour weight change: Weight change: -8.3 oz (-0.236 kg)  Weight trends: Filed Weights   08/06/15 2106 08/07/15 0441 08/08/15 0241  Weight: 153 lb 4.8 oz (69.536 kg) 153 lb (69.4 kg) 152 lb 12.5 oz (69.3 kg)    Intake/Output:  12/28 0701 - 12/29 0700 In: 969.1 [P.O.:420; I.V.:549.1] Out: 2700 [Urine:2700] Total I/O In: 18 [I.V.:18] Out: -    Physical Exam: BP 138/43 mmHg  Pulse 61  Temp(Src) 98.3 F (36.8 C) (Oral)  Resp 19  Ht 5\' 5"  (1.651 m)  Wt 152 lb 12.5 oz (69.3 kg)  BMI 25.42 kg/m2  SpO2 94%  Wt Readings from Last 3 Encounters:  08/08/15 152 lb 12.5 oz (69.3 kg)    General: Vital signs reviewed and noted.   Head: Normocephalic, atraumatic.  Eyes: conjunctivae/corneas clear.  EOM's intact.   Throat: normal  Neck:  normal  Lungs:    clear   Heart:  RR   Abdomen:  Soft, non-tender, non-distended    Extremities: No edema .  Good pulses.  Right wrist cath site is stable  Neurologic: A&O X3, CN II - XII are grossly intact.   Psych: Normal     Labs: BMET:  Recent Labs  08/07/15 0103 08/08/15 0349  NA 139 139  K 3.9 4.0  CL 107 110  CO2 21* 22  GLUCOSE 277* 153*  BUN 15 10  CREATININE 1.03* 0.79  CALCIUM 8.5* 8.8*  MG 1.9  --     Liver function tests:  Recent Labs  08/07/15 0103  AST 19  ALT 32  ALKPHOS 75  BILITOT 0.6  PROT 6.3*  ALBUMIN 2.9*   No results for input(s): LIPASE, AMYLASE in the last 72 hours.  CBC:  Recent Labs  08/07/15 0103 08/08/15 0349  WBC 10.4 9.3  NEUTROABS 6.3  --   HGB 10.8* 11.5*  HCT 33.4* 35.0*  MCV 87.7 86.4  PLT 261 284    Cardiac Enzymes:  Recent Labs  08/07/15 0103 08/07/15 0605 08/07/15 1212  TROPONINI 0.26* 0.18* 0.14*    Coagulation Studies:  Recent Labs  08/07/15 0103  LABPROT 15.9*  INR 1.26    Other: Invalid input(s): POCBNP No results for input(s): DDIMER in the last 72 hours.  Recent Labs  08/07/15 0104  HGBA1C 8.1*   No results for input(s): CHOL, HDL, LDLCALC, TRIG, CHOLHDL in the last 72 hours.  Recent Labs  08/07/15 0103  TSH 5.199*   No results for input(s): VITAMINB12, FOLATE, FERRITIN, TIBC, IRON, RETICCTPCT in the last 72 hours.   Other results:  Tele  ( personally reviewed )  -   Sinus brady at  58  Medications:    Infusions: . heparin Stopped (08/08/15 0834)    Scheduled Medications: . apixaban  5 mg Oral BID  . aspirin EC  81 mg Oral Daily  . atorvastatin  40 mg Oral QPM  . clopidogrel  75 mg Oral Q breakfast  . insulin aspart  0-9 Units Subcutaneous TID WC  . latanoprost  1 drop Both Eyes QHS  . metoprolol succinate  25 mg Oral q morning - 10a  . sodium chloride  3 mL Intravenous Q12H    Assessment/ Plan:   Principal Problem:   NSTEMI (non-ST elevated myocardial infarction) ([place]) Active Problems:   Diabetes mellitus without complication ([place])   Hypertension   Hyperlipidemia  1. Coronary artery disease: Pt presented with NSTEMI.  found   2. Atrial fibrillation: The patient presented to [facility] with supraventricular tachycardia which was treated with adenosine. She then developed atrial fibrillation with rapid ventricular response.  She has now converted with IV diltiazem.  She has had a CHADS2VASC score of 4      We have  started Eliquis. I would suggest that we treat with Eliquis , Plavix and ASA for 1 month and then hold the ASA with the idea that she would continue plavix and Eliquis for 1 year ( potentially longer / indefinitely )   She wants to follow up with [place] here in [place].  Will arrange for her to see [place] in a week   [place] for DC    Disposition:  Length of Stay: 2  Thayer Headings, Brooke Bonito., MD, [facility] 08/08/2015, 8:36 AM Office [phone] Pager [phone]

## 2015-08-08 NOTE — Progress Notes (Signed)
CARDIAC REHAB PHASE I   PRE:  Rate/Rhythm: 70 SR  BP:  Supine:   Sitting: 139/48  Standing:    SaO2:   MODE:  Ambulation: 800 ft   POST:  Rate/Rhythm: 79 SR  BP:  Supine:   Sitting: 141/50  Standing:    SaO2:  0800-0900 Pt walked 800 ft with steady gait and no CP. Tolerated well. MI education completed with pt who voiced understanding. Stressed importance of plavix with stent. Reviewed NTG use, risk factors, ex ed and CRP 2. Pt has OFF the BEAT booklet and we discussed purpose of eliquis with a fib. Discussed CRP 2 and referring to [facility].  Discussed carb counting and let pt know A1C at 8.1. She stated was at 7.5 and not on med. Encouraged her to follow up with Medical Doctor.   Graylon Good, RN BSN  08/08/2015 8:55 AM

## 2015-08-08 NOTE — Discharge Summary (Signed)
Discharge Summary   Patient ID: Brandi Frey MRN: [id number], DOB/AGE: [birth year] 72 y.o. Admit date: 08/06/2015 D/C date:     08/08/2015  Primary Cardiologist: [name] --> wants to switch to [facility]: [name].  Principal Problem:   NSTEMI (non-ST elevated myocardial infarction) ([place]) Active Problems:   Hypertension   Hyperlipidemia   PAF (paroxysmal atrial fibrillation) ([place])   Diabetes mellitus, type 2 (H[facility])   Coronary artery disease   SVT (supraventricular tachycardia) ([place])   Admission Dates: 08/06/15- 07/28/15 Discharge Diagnosis: SVT, afib with RVR and NSTEMI s/p DES to LAD.   HPI: Brandi Frey is a 74 y.o. female with a history of DM, HTN, HLD, CAD s/p DES x 2 to the LAD (2009), newly diagnosed atrial fibrillation and SVT who was transferred to [facility] from [facility] ER on 08/06/15 for chest pain, NSTEMI, afib with RVR and SVT.   She apparently was seen in [place] 2 weeks prior to admission for CP on 07/25/15 for 1 day. She underwent Chest CT which ruled out PE but could not do cath because kidney function declined after contrast.She had a stress test the next day that family says was normal but was told that echo was mildly abnormal. She was supposed to followup with her cardiologist on the 17th of January. She had reoccurrence of CP and woke up the morning of admission with chest pain that she described as a tightness that radiated into her jaw and was associated with SOB and palpitations. She got very dizzy and felt like she was going to pass out.She presented to the [facility] ER and her EKG showed SVT at 190bpm and was given IV Adenosine 6mg  and then 12mg  and then given Cardizem bolus of 15mg  and then started on an IV gtt. Heart rate decreased to low 100's and was noted to be atrial fibrillation. She subsequently converted to sinus bradycardia at 55bpm with PVC's. Chest xray showed mild pulmonary vascular congestion. WBC mildly elevated at 11.5. Creatinine was 1.14  and glucose 258. Pro BNP elevated at 8538. Mag was low at 1.6. Initial troponin was 0.14. Her mag was repleted and she was started on IV Heparin gtt. Her Cardizem gtt was stopped due to hypotension and bradycardia post conversion. There were no available beds at [facility] or at [facility] and she was transferred to [facility] for further evaluation and treatment.   She maintained NSR with a few brief runs of SVT/afib. Peak troponin 0.26. She underwent LHC on 08/07/15 which showed mild ISR of previous LAD stents, non obst CAD elsewhere and mid LAD with 99% stenosis beyond the diagonal branch- s/p DES to LAD. The day after her catheterization, heparin gtt discontinued and started on Eliquis 5mg  BID as she she has a  CHADS2VASC score of 4. She will continue on ASA + plavix + Eliquis for 1 month followed by plavix + Eliquis. She previously followed with Dr. Liane Comber in [place], but is asking to be switched to our practice.     Hospital Course  New onset atrial fibrillation with RVR: she spontaneously converted to NSR on IV cardizem. This was later discontinued due to sinus bradycardia/hypotension after conversion.She has a CHADS2VASC score of 4. Started on Eliquis 5mg  BID. Continue Toprol XL 25mg  daily.   Chest pain/NSTEMI: peak troponin 0.26. She underwent LHC on 08/07/15 s/p DES to LAD. Mild ISR of previous LAD stents. Non obst CAD elsewhere  -- 2D ECHO with EF 55-60%. MAC with mod MR, mildly dilated LA, mildly dilated  RA, PA pk pressure 38 mm Hg.  -- Continue statin and BB. Will plan to continue plavix and start Eliquis. Will continue ASA for 1 month and then continue on plavix and eliquis alone. Creat stable post cath.   HTN: BP well controlled this AM. Continue Toprol XL 25mg    Dyslipidemia: continue atorvastatin. LDL goal <70.  DM: HbA1C 8.1. She will need better controlled of her BS. It doesn't appear that she is on anything for her DM. Follow up with PCP. I will start her on Metformin 48 hours after  heart cath ( sat AM).   SVT: continue BB.   The patient has had an uncomplicated hospital course and is recovering well. The radial catheter site is stable. She has been seen by Dr. Acie Fredrickson today and deemed ready for discharge home. All follow-up appointments have been scheduled. Discharge medications are listed below.   Discharge Vitals: Blood pressure 139/48, pulse 71, temperature 98 F (36.7 C), temperature source Oral, resp. rate 18, height 5\' 5"  (1.651 m), weight 152 lb 12.5 oz (69.3 kg), SpO2 95 %.  General: Vital signs reviewed and noted.   Head: Normocephalic, atraumatic.  Eyes: conjunctivae/corneas clear. EOM's intact.   Throat: normal  Neck: normal  Lungs:   clear   Heart: RR   Abdomen:  Soft, non-tender, non-distended   Extremities: No edema . Good pulses. Right wrist cath site is stable  Neurologic: A&O X3, CN II - XII are grossly intact.   Psych: Normal     Labs: Lab Results  Component Value Date   WBC 9.3 08/08/2015   HGB 11.5* 08/08/2015   HCT 35.0* 08/08/2015   MCV 86.4 08/08/2015   PLT 284 08/08/2015     Recent Labs Lab 08/07/15 0103 08/08/15 0349  NA 139 139  K 3.9 4.0  CL 107 110  CO2 21* 22  BUN 15 10  CREATININE 1.03* 0.79  CALCIUM 8.5* 8.8*  PROT 6.3*  --   BILITOT 0.6  --   ALKPHOS 75  --   ALT 32  --   AST 19  --   GLUCOSE 277* 153*    Recent Labs  08/07/15 0103 08/07/15 0605 08/07/15 1212  TROPONINI 0.26* 0.18* 0.14*     Diagnostic Studies/Procedures   LHC 08/07/15 Conclusion     Prox RCA lesion, 20% stenosed.  Mid RCA lesion, 40% stenosed.  2nd Mrg lesion, 20% stenosed.  Ost 1st Mrg lesion, 40% stenosed.  Patent stent proximal LAD with diffuse 40-50% stent restenosis.  Mid LAD with 99% stenosis beyond the diagonal branch. This is just proximal to the previously placed stent. The stent has 60% restenosis on the proximal edge. Following PCI the stenosis is 0%.  The left ventricular  systolic function is normal.  Recommendations: Will continue Plavix and ASA tonight. If there are plans for long term anti-coagulation, would continue Plavix along with the anti-coagulant of choice by the rounding team. I would stop ASA after anti-coagulant is started. Will restart IV heparin 8 hours post sheath pull tonight. Continue beta blocker and statin.     2D ECHO: 08/07/2015 LV EF: 55 - 60% Study Conclusions - Left ventricle: The cavity size was normal. Wall thickness was   normal. Systolic function was normal. The estimated ejection   fraction was in the range of 55% to 60%. Doppler parameters are   consistent with elevated ventricular end-diastolic filling   pressure. - Mitral valve: MAC with restricted posterior leafelt motion There   was moderate  regurgitation. - Left atrium: The atrium was mildly dilated. - Right atrium: The atrium was mildly dilated. - Atrial septum: No defect or patent foramen ovale was identified. - Pulmonary arteries: PA peak pressure: 38 mm Hg (S). - Pericardium, extracardiac: A trivial pericardial effusion was   identified posterior to the heart.  Discharge Medications     Medication List    TAKE these medications        acetaminophen 500 MG tablet  Commonly known as:  TYLENOL  Take 1,000 mg by mouth every 6 (six) hours as needed for moderate pain.     apixaban 5 MG Tabs tablet  Commonly known as:  ELIQUIS  Take 1 tablet (5 mg total) by mouth 2 (two) times daily.     aspirin EC 81 MG tablet  Take 81 mg by mouth daily.     atorvastatin 80 MG tablet  Commonly known as:  LIPITOR  Take 0.5 tablets (40 mg total) by mouth every evening.     Cinnamon 500 MG Tabs  Take 1,000 mg by mouth 2 (two) times daily.     clopidogrel 75 MG tablet  Commonly known as:  PLAVIX  Take 1 tablet (75 mg total) by mouth daily with breakfast.     metFORMIN 500 MG tablet  Commonly known as:  GLUCOPHAGE  Take 1 tablet (500 mg total) by mouth daily after  breakfast.     metoprolol succinate 25 MG 24 hr tablet  Commonly known as:  TOPROL-XL  Take 25 mg by mouth every morning.     nitroGLYCERIN 0.4 MG SL tablet  Commonly known as:  NITROSTAT  Place 1 tablet (0.4 mg total) under the tongue every 5 (five) minutes x 3 doses as needed for chest pain.     Travoprost (BAK Free) 0.004 % Soln ophthalmic solution  Commonly known as:  TRAVATAN  Place 1 drop into both eyes at bedtime.        Disposition   The patient will be discharged in stable condition to home. Discharge Instructions    Amb Referral to Cardiac Rehabilitation    Complete by:  As directed   Diagnosis:   Myocardial Infarction Comment - referring to [facility] PCI            Follow-up Information    Follow up with Eileen Stanford, PA-C On 08/15/2015.   Specialties:  Cardiology, Radiology   Why:  @ 10:30am   Contact information:   [place] [address] [phone]         Duration of Discharge Encounter: Greater than 30 minutes including physician and PA time.  SignedAngelena Form R PA-C 08/08/2015, 9:06 AM   Attending Note:   The patient was seen and examined.  Agree with assessment and plan as noted above.  Changes made to the above note as needed.  Pt has done well following PCI. See progress note.   Thayer Headings, Brooke Bonito., MD, [facility] 08/08/2015, 9:46 PM [address]  [place] Pager [phone]

## 2015-08-08 NOTE — Discharge Instructions (Signed)

## 2015-08-08 NOTE — Progress Notes (Signed)
[place] for apixaban Indication: Afib  Allergies  Allergen Reactions  . Adhesive [Tape] Other (See Comments)    BLISTERING  . Latex Other (See Comments)    BLISTERING  . Metformin And Related     Patient Measurements: Height: 5\' 5"  (165.1 cm) Weight: 152 lb 12.5 oz (69.3 kg) IBW/kg (Calculated) : 57  Heparin dosing wt: 69 kg  Vital Signs: Temp: 98.3 F (36.8 C) (12/29 0241) Temp Source: Oral (12/29 0241) BP: 138/43 mmHg (12/29 0241) Pulse Rate: 61 (12/29 0241)  Labs (at OSH) : WBC  11.5 Hgb  12.2 Hct  37.1 Plt  278  SCr  1.14  Recent Labs  08/07/15 0102 08/07/15 0103 08/07/15 0605 08/07/15 1212 08/08/15 0349  HGB  --  10.8*  --   --  11.5*  HCT  --  33.4*  --   --  35.0*  PLT  --  261  --   --  284  APTT  --  43*  --   --   --   LABPROT  --  15.9*  --   --   --   INR  --  1.26  --   --   --   HEPARINUNFRC <0.10*  --   --  0.43  --   CREATININE  --  1.03*  --   --  0.79  TROPONINI  --  0.26* 0.18* 0.14*  --     Estimated Creatinine Clearance: 62.1 mL/min (by C-G formula based on Cr of 0.79).   Medical History: Past Medical History  Diagnosis Date  . Hypertension   . Diabetes mellitus without complication ([place])   . Hyperlipidemia   . Coronary artery disease 2009    PCI of LAD x 2 wth DES in [facility]       Assessment: 74 y.o. female with chest pain for heparin. She is now s/p cath with stent to the LAD. She is also noted with afib (CHADSVASC= 5). Heparin was restarted but now transition to apixaban for long term anticoag. Plan is to stop ASA when apixaban started.   Goal of Therapy:  Heparin level 0.3-0.7 units/ml Monitor platelets by anticoagulation protocol: Yes   Plan:   Apixaban 5mg  PO BID  Onnie Boer, PharmD Pager: [phone] 08/12/2015 8:40 AM

## 2015-08-08 NOTE — Telephone Encounter (Signed)
TCM  With Angelena Form   01/05 @ 10:30 am

## 2015-08-09 NOTE — Telephone Encounter (Signed)
Left a message for the pt to call back for TCM follow-up. 

## 2015-08-13 NOTE — Telephone Encounter (Signed)
**Note De-Identified [place] Obfuscation** LMTCB. Advised to ask for triage nurse when she calls back.

## 2015-08-14 ENCOUNTER — Ambulatory Visit (INDEPENDENT_AMBULATORY_CARE_PROVIDER_SITE_OTHER): Payer: Medicare Other | Admitting: Physician Assistant

## 2015-08-14 ENCOUNTER — Telehealth: Payer: Self-pay

## 2015-08-14 ENCOUNTER — Encounter: Payer: Self-pay | Admitting: Physician Assistant

## 2015-08-14 VITALS — BP 140/70 | HR 80 | Ht 65.0 in | Wt 147.0 lb

## 2015-08-14 DIAGNOSIS — I480 Paroxysmal atrial fibrillation: Secondary | ICD-10-CM | POA: Diagnosis not present

## 2015-08-14 DIAGNOSIS — E785 Hyperlipidemia, unspecified: Secondary | ICD-10-CM

## 2015-08-14 DIAGNOSIS — I2510 Atherosclerotic heart disease of native coronary artery without angina pectoris: Secondary | ICD-10-CM | POA: Diagnosis not present

## 2015-08-14 DIAGNOSIS — I214 Non-ST elevation (NSTEMI) myocardial infarction: Secondary | ICD-10-CM | POA: Diagnosis not present

## 2015-08-14 DIAGNOSIS — R079 Chest pain, unspecified: Secondary | ICD-10-CM | POA: Diagnosis not present

## 2015-08-14 DIAGNOSIS — Z9861 Coronary angioplasty status: Secondary | ICD-10-CM

## 2015-08-14 DIAGNOSIS — I340 Nonrheumatic mitral (valve) insufficiency: Secondary | ICD-10-CM

## 2015-08-14 DIAGNOSIS — I10 Essential (primary) hypertension: Secondary | ICD-10-CM

## 2015-08-14 LAB — CBC WITH DIFFERENTIAL/PLATELET
BASOS ABS: 0.1 10*3/uL (ref 0.0–0.1)
Basophils Relative: 1 % (ref 0–1)
Eosinophils Absolute: 0.3 10*3/uL (ref 0.0–0.7)
Eosinophils Relative: 3 % (ref 0–5)
HEMATOCRIT: 40.4 % (ref 36.0–46.0)
HEMOGLOBIN: 13.4 g/dL (ref 12.0–15.0)
LYMPHS ABS: 2.3 10*3/uL (ref 0.7–4.0)
LYMPHS PCT: 24 % (ref 12–46)
MCH: 28 pg (ref 26.0–34.0)
MCHC: 33.2 g/dL (ref 30.0–36.0)
MCV: 84.5 fL (ref 78.0–100.0)
MONOS PCT: 9 % (ref 3–12)
MPV: 11.3 fL (ref 8.6–12.4)
Monocytes Absolute: 0.8 10*3/uL (ref 0.1–1.0)
NEUTROS ABS: 5.9 10*3/uL (ref 1.7–7.7)
NEUTROS PCT: 63 % (ref 43–77)
PLATELETS: 315 10*3/uL (ref 150–400)
RBC: 4.78 MIL/uL (ref 3.87–5.11)
RDW: 13.4 % (ref 11.5–15.5)
WBC: 9.4 10*3/uL (ref 4.0–10.5)

## 2015-08-14 LAB — BASIC METABOLIC PANEL
BUN: 20 mg/dL (ref 7–25)
CHLORIDE: 102 mmol/L (ref 98–110)
CO2: 21 mmol/L (ref 20–31)
CREATININE: 0.91 mg/dL (ref 0.60–0.93)
Calcium: 9.8 mg/dL (ref 8.6–10.4)
GLUCOSE: 114 mg/dL — AB (ref 65–99)
Potassium: 4.6 mmol/L (ref 3.5–5.3)
Sodium: 137 mmol/L (ref 135–146)

## 2015-08-14 NOTE — Telephone Encounter (Signed)
Prior auth for Eliquis 5 mg sent to [facility]. 

## 2015-08-14 NOTE — Progress Notes (Signed)
Cardiology Office Note Date:  08/14/2015  Patient ID:  Brandi Frey, DOB Feb 08, 1941, MRN [id number] PCP:  [name], MD  Cardiologist:  Previously [name] in [place] but patient wants to follow with [name]  Chief Complaint: f/u cath/NSTEMI, afib, chest pain  History of Present Illness: Brandi Frey is a 75 y.o. female with history of DM, HTN, HLD, CAD (s/p DES x 2 to the LAD in 2009), mod MR by echo 07/2015, recently diagnosed AF/SVT who presents back to the office for post-hospital follow-up. She was admitted to [facility] 12/27-12/29 with chest pain, NSTEMI, afib RVR and SVT.   She apparently was seen in [place] 2 weeks prior to admission for CP on 07/25/15. She underwent chest CT which ruled out PE but could not do cath because kidney function declined after contrast.She had a stress test the next day that family says was normal but was told that echo was mildly abnormal.She was supposed to followup with her cardiologist on the 17th of January.She had reoccurrence of CP, jaw pain, SOB, palpitations and dizziness so went back to the [facility] ER on 08/06/15 where EKG showed SVT 190bpm. She was given IV adenosine and cardizem - HR decreased to low 100s into atrial fib and she subsequently converted to SB with PVCS. Cardizem was later discontinued due to hypotension and bradycardia. There were no available beds at [facility] or at [facility] and she was transferred to [facility] for further evaluation and treatment. Initial troponin was 0.14, Cr 1.14, Mg 1.6. At [facility], her troponin peaked at 0.26. She underwent LHC on 08/07/15 which showed mild ISR of previous LAD stents, mid LAD with 99% stenosis beyond the diagonal branch- s/p DES to LAD, otherwise nonobstructive disease. It was recommended she continue on ASA + Plavix + Eliquis for 1 month followed by Plavix + Eliquis. Cr at discharge 0.79, Hgb 11.5 (FOBT neg), f/u Mg 1.9. 2D Echo 08/07/15: EF 55-60%, elevated LVEDP, MAC with  restricted posterior leaflet motion, mod MR, LA/RA mildly dilated, PASP 38, trivial pericardial effusion.  She comes in today for follow-up. Overall she feels she's done well, except she did have an episode of chest discomfort yesterday lasting all day. It did not feel like her prior angina. She said it felt more like indigestion. It seemed to be worse when bending over. She was able to taste the food she had previously eaten when she burped (she drank soda water and vinegar water to help burp). She wonders if it was related to some of her larger pills going down her esophagus. She denies any palpitations, SOB, nausea, vomiting or bleeding. It was not worse with exertion, inspiration or palpation. It was not relieved by anything in particular. When she woke up this AM, the pain was gone and it has not recurred since. She has gotten back into regular ADLs and has not noticed any symptoms with exertion recently. She plans to participate in cardiac rehab in [place].   Past Medical History  Diagnosis Date  . Hypertension   . Hyperlipidemia   . Coronary artery disease     a. 2009: DES x2 to LAD in [facility]   b. 07/2015: NSTEMI s/p DES to LAD.  . Diabetes mellitus, type 2 ([place])   . SVT (supraventricular tachycardia) ([place])   . PAF (paroxysmal atrial fibrillation) ([place])     a. newly diagnosed 07/2015 in the setting of NSTEMI. Spont conv to NSR/SB. Started on Eliquis.  . Mitral regurgitation     Past Surgical  History  Procedure Laterality Date  . Cardiac catheterization    . Vaginal hysterectomy    . Coronary angioplasty  2009    LAD x 2 with DES  . Cardiac catheterization N/A 08/07/2015    Procedure: Left Heart Cath and Coronary Angiography;  Surgeon: Burnell Blanks, MD;  Location: [place] CV [facility];  Service: Cardiovascular;  Laterality: N/A;  . Cardiac catheterization N/A 08/07/2015    Procedure: Coronary Stent Intervention;  Surgeon: Burnell Blanks, MD;  Location: [place] CV [facility];  Service: Cardiovascular;  Laterality: N/A;  mid LAD    Current Outpatient Prescriptions  Medication Sig Dispense Refill  . acetaminophen (TYLENOL) 500 MG tablet Take 1,000 mg by mouth every 6 (six) hours as needed for moderate pain.    Marland Kitchen apixaban (ELIQUIS) 5 MG TABS tablet Take 1 tablet (5 mg total) by mouth 2 (two) times daily. 60 tablet 6  . atorvastatin (LIPITOR) 80 MG tablet Take 40 mg by mouth daily.    . Cinnamon 500 MG TABS Take 1,000 mg by mouth 2 (two) times daily.    . clopidogrel (PLAVIX) 75 MG tablet Take 1 tablet (75 mg total) by mouth daily with breakfast. 30 tablet 12  . metFORMIN (GLUCOPHAGE) 500 MG tablet Take 1 tablet (500 mg total) by mouth daily after breakfast. 30 tablet 1  . metoprolol succinate (TOPROL-XL) 25 MG 24 hr tablet Take 25 mg by mouth every morning.  99  . nitroGLYCERIN (NITROSTAT) 0.4 MG SL tablet Place 1 tablet (0.4 mg total) under the tongue every 5 (five) minutes x 3 doses as needed for chest pain. 25 tablet 11  . Travoprost, BAK Free, (TRAVATAN) 0.004 % SOLN ophthalmic solution Place 1 drop into both eyes at bedtime.      + Aspirin 81mg  by mouth daily  Allergies:   Adhesive; Latex; Metformin; and Metformin and related   Social History:  The patient  reports that she has never smoked. She has never used smokeless tobacco. She reports that she drinks alcohol. She reports that she does not use illicit drugs.   Family History:  The patient's family history includes CVA in her father; Diabetes in her brother; Heart attack in her brother; Heart disease in her brother; Hypertension in her sister; Ovarian cancer in her mother; Stroke in her father.  ROS:  Please see the history of present illness.  All other systems are reviewed and otherwise negative.   PHYSICAL EXAM:  VS:  BP 140/70 mmHg  Pulse 80  Ht 5\' 5"  (1.651 m)  Wt 147 lb (66.679 kg)  BMI 24.46 kg/m2 BMI: Body mass index is 24.46 kg/(m^2). Well nourished, well developed WF in no  acute distress HEENT: normocephalic, atraumatic Neck: no JVD, carotid bruits or masses Cardiac:  normal S1, S2; RRR; no murmurs, rubs, or gallops Lungs:  clear to auscultation bilaterally, no wheezing, rhonchi or rales Abd: soft, nontender, no hepatomegaly, + BS MS: no deformity or atrophy Ext: no edema. Right radial cath site without hematoma; good pulse - mod ecchymosis noted at cath site in the middle stages of resolution  Skin: warm and dry, no rash Neuro:  moves all extremities spontaneously, no focal abnormalities noted, follows commands Psych: euthymic mood, full affect  EKG:  Done today shows NSR 61bpm, anterolateral TWI V2-V5, nonspecific ST sagging inferiorly - similar to prior tracing  Recent Labs: 08/07/2015: ALT 32; Magnesium 1.9; TSH 5.199* 08/08/2015: BUN 10; Creatinine, Ser 0.79; Hemoglobin 11.5*; Platelets 284; Potassium 4.0; Sodium 139  No results found for requested labs within last 365 days.   Estimated Creatinine Clearance: 57.2 mL/min (by C-G formula based on Cr of 0.79).   Wt Readings from Last 3 Encounters:  08/14/15 147 lb (66.679 kg)  08/08/15 152 lb 12.5 oz (69.3 kg)     Other studies reviewed: Additional studies/records reviewed today include: summarized above  ASSESSMENT AND PLAN:  1. Chest pain - somewhat atypical, possibly reflective of indigestion. No high risk features. EKG is stable compared to prior. Patient's history and EKG reviewed with Dr. Curt Bears (DOD) who recommends observation for recurrent symptoms. The patient was instructed to be vigilant about letting us know if chest pain recurs. For now will continue current plan as below. Warning signs reviewed. 2. CAD with recent NSTEMI as above - plan to discontinue ASA after 1 month. Her last dose of aspirin will be 09/07/15. Continue BB and statin at present doses. I gave her a copy of her EKG to keep in her wallet since she lives in [place] in case she requires future care up there. (It would be  helpful for future providers to have this to compare to.) I told them if they do not hear from [facility] regarding cardiac rehab by the end of the week to give [place] a call. The patient works as a Product manager part time (9 hours total light duty on the weekends, split up over 3 days) and inquired about returning to work this upcoming weekend. I told her I thought this should be OK as long as she continues to feel well. Will check BMET today to ensure stability given recent bump in Cr related to CT contrast. 3. PAF - maintaining NSR. Yesterday's episode did not remind her of when she was in atrial fib. Continue BB and Eliquis. Will check CBC since she was recently started on triple anticoag therapy and has had mild anemia. She will also need recheck of thyroid function by PCP given recent TSH 5.199. 4. Essential HTN - continue current regimen. 5. Hyperlipidemia - she remains on atorvastatin 40mg  daily which is her chronic dose. Recent AST/ALT were normal. 6. Mod MR - follow clinically.  Disposition: F/u with [name] in 2-3 months.  Current medicines are reviewed at length with the patient today.  The patient did not have any concerns regarding medicines.  Raechel Ache PA-C 08/14/2015 11:57 AM     [facility] [address] [place] [address] [phone] (office)  [phone] (fax)

## 2015-08-14 NOTE — Telephone Encounter (Signed)
Seen in office 1/4 17 by Melina Copa

## 2015-08-14 NOTE — Patient Instructions (Signed)
Medication Instructions:   Take your last dose of aspirin 09/07/15.  Labwork: BMET/CBCd today  Testing/Procedures: None today  Follow-Up: Your physician recommends that you schedule a follow-up appointment in: 2-3 months with Dr Radford Pax.         If you need a refill on your cardiac medications before your next appointment, please call your pharmacy.

## 2015-08-15 ENCOUNTER — Telehealth: Payer: Self-pay

## 2015-08-15 ENCOUNTER — Encounter: Payer: Medicare (Managed Care) | Admitting: Physician Assistant

## 2015-08-15 NOTE — Telephone Encounter (Signed)
Eliquis approved through 08/13/2015. PA # [id number].

## 2015-09-18 ENCOUNTER — Emergency Department (HOSPITAL_COMMUNITY): Payer: Medicare Other

## 2015-09-18 ENCOUNTER — Encounter (HOSPITAL_COMMUNITY): Payer: Self-pay | Admitting: *Deleted

## 2015-09-18 ENCOUNTER — Telehealth: Payer: Self-pay | Admitting: Cardiology

## 2015-09-18 ENCOUNTER — Inpatient Hospital Stay (HOSPITAL_COMMUNITY): Payer: Medicare Other

## 2015-09-18 ENCOUNTER — Inpatient Hospital Stay (HOSPITAL_COMMUNITY)
Admission: EM | Admit: 2015-09-18 | Discharge: 2015-09-22 | DRG: 871 | Disposition: A | Discharge status: Home / Self Care | Payer: Medicare Other | Attending: Family Medicine | Admitting: Family Medicine

## 2015-09-18 DIAGNOSIS — Z66 Do not resuscitate: Secondary | ICD-10-CM | POA: Diagnosis present

## 2015-09-18 DIAGNOSIS — Z7902 Long term (current) use of antithrombotics/antiplatelets: Secondary | ICD-10-CM | POA: Diagnosis not present

## 2015-09-18 DIAGNOSIS — E785 Hyperlipidemia, unspecified: Secondary | ICD-10-CM | POA: Diagnosis present

## 2015-09-18 DIAGNOSIS — I309 Acute pericarditis, unspecified: Secondary | ICD-10-CM | POA: Diagnosis present

## 2015-09-18 DIAGNOSIS — R6510 Systemic inflammatory response syndrome (SIRS) of non-infectious origin without acute organ dysfunction: Secondary | ICD-10-CM | POA: Diagnosis not present

## 2015-09-18 DIAGNOSIS — R0602 Shortness of breath: Secondary | ICD-10-CM

## 2015-09-18 DIAGNOSIS — I340 Nonrheumatic mitral (valve) insufficiency: Secondary | ICD-10-CM | POA: Diagnosis present

## 2015-09-18 DIAGNOSIS — I2510 Atherosclerotic heart disease of native coronary artery without angina pectoris: Secondary | ICD-10-CM | POA: Diagnosis present

## 2015-09-18 DIAGNOSIS — I480 Paroxysmal atrial fibrillation: Secondary | ICD-10-CM | POA: Diagnosis present

## 2015-09-18 DIAGNOSIS — I10 Essential (primary) hypertension: Secondary | ICD-10-CM | POA: Diagnosis present

## 2015-09-18 DIAGNOSIS — I471 Supraventricular tachycardia: Secondary | ICD-10-CM | POA: Diagnosis present

## 2015-09-18 DIAGNOSIS — A419 Sepsis, unspecified organism: Principal | ICD-10-CM | POA: Diagnosis present

## 2015-09-18 DIAGNOSIS — E118 Type 2 diabetes mellitus with unspecified complications: Secondary | ICD-10-CM | POA: Diagnosis present

## 2015-09-18 DIAGNOSIS — Z955 Presence of coronary angioplasty implant and graft: Secondary | ICD-10-CM

## 2015-09-18 DIAGNOSIS — R7989 Other specified abnormal findings of blood chemistry: Secondary | ICD-10-CM | POA: Insufficient documentation

## 2015-09-18 DIAGNOSIS — E872 Acidosis: Secondary | ICD-10-CM | POA: Diagnosis not present

## 2015-09-18 DIAGNOSIS — I319 Disease of pericardium, unspecified: Secondary | ICD-10-CM | POA: Diagnosis not present

## 2015-09-18 DIAGNOSIS — Z7901 Long term (current) use of anticoagulants: Secondary | ICD-10-CM

## 2015-09-18 DIAGNOSIS — I3139 Other pericardial effusion (noninflammatory): Secondary | ICD-10-CM | POA: Insufficient documentation

## 2015-09-18 DIAGNOSIS — I252 Old myocardial infarction: Secondary | ICD-10-CM | POA: Diagnosis not present

## 2015-09-18 DIAGNOSIS — I313 Pericardial effusion (noninflammatory): Secondary | ICD-10-CM | POA: Insufficient documentation

## 2015-09-18 DIAGNOSIS — R079 Chest pain, unspecified: Secondary | ICD-10-CM | POA: Diagnosis not present

## 2015-09-18 DIAGNOSIS — J189 Pneumonia, unspecified organism: Secondary | ICD-10-CM | POA: Diagnosis present

## 2015-09-18 DIAGNOSIS — J90 Pleural effusion, not elsewhere classified: Secondary | ICD-10-CM | POA: Diagnosis not present

## 2015-09-18 HISTORY — DX: Acute myocardial infarction, unspecified: I21.9

## 2015-09-18 HISTORY — DX: Pneumonia, unspecified organism: J18.9

## 2015-09-18 HISTORY — DX: Malignant neoplasm of unspecified site of left female breast: C50.912

## 2015-09-18 LAB — URINALYSIS, ROUTINE W REFLEX MICROSCOPIC
BILIRUBIN URINE: NEGATIVE
GLUCOSE, UA: 250 mg/dL — AB
KETONES UR: NEGATIVE mg/dL
Nitrite: NEGATIVE
PROTEIN: NEGATIVE mg/dL
Specific Gravity, Urine: 1.014 (ref 1.005–1.030)
pH: 5 (ref 5.0–8.0)

## 2015-09-18 LAB — BASIC METABOLIC PANEL
ANION GAP: 12 (ref 5–15)
BUN: 10 mg/dL (ref 6–20)
CALCIUM: 9.3 mg/dL (ref 8.9–10.3)
CHLORIDE: 100 mmol/L — AB (ref 101–111)
CO2: 24 mmol/L (ref 22–32)
Creatinine, Ser: 0.76 mg/dL (ref 0.44–1.00)
GFR calc Af Amer: >60 mL/min (ref >60)
GFR calc non Af Amer: >60 mL/min (ref >60)
GLUCOSE: 253 mg/dL — AB (ref 65–99)
Potassium: 4.4 mmol/L (ref 3.5–5.1)
Sodium: 136 mmol/L (ref 135–145)

## 2015-09-18 LAB — I-STAT TROPONIN, ED: TROPONIN I, POC: 0 ng/mL (ref 0.00–0.08)

## 2015-09-18 LAB — CBC
HCT: 35.7 % — ABNORMAL LOW (ref 36.0–46.0)
HEMOGLOBIN: 11.5 g/dL — AB (ref 12.0–15.0)
MCH: 27.3 pg (ref 26.0–34.0)
MCHC: 32.2 g/dL (ref 30.0–36.0)
MCV: 84.6 fL (ref 78.0–100.0)
Platelets: 209 10*3/uL (ref 150–400)
RBC: 4.22 MIL/uL (ref 3.87–5.11)
RDW: 13.5 % (ref 11.5–15.5)
WBC: 13.1 10*3/uL — ABNORMAL HIGH (ref 4.0–10.5)

## 2015-09-18 LAB — TSH: TSH: 3.937 u[IU]/mL (ref 0.350–4.500)

## 2015-09-18 LAB — URINE MICROSCOPIC-ADD ON: RBC / HPF: NONE SEEN RBC/hpf (ref 0–5)

## 2015-09-18 LAB — INFLUENZA PANEL BY PCR (TYPE A & B)
H1N1 flu by pcr: NOT DETECTED
Influenza A By PCR: NEGATIVE
Influenza B By PCR: NEGATIVE

## 2015-09-18 LAB — TROPONIN I: TROPONIN I: 0.03 ng/mL (ref <0.031)

## 2015-09-18 LAB — I-STAT CG4 LACTIC ACID, ED
LACTIC ACID, VENOUS: 2.18 mmol/L — AB (ref 0.5–2.0)
LACTIC ACID, VENOUS: 0.75 mmol/L (ref 0.5–2.0)

## 2015-09-18 LAB — GLUCOSE, CAPILLARY: GLUCOSE-CAPILLARY: 354 mg/dL — AB (ref 65–99)

## 2015-09-18 MED ORDER — LATANOPROST 0.005 % OP SOLN
1.0000 [drp] | Freq: Every day | OPHTHALMIC | Status: DC
  Administered 2015-09-18 – 2015-09-21 (×4): 1 [drp] via OPHTHALMIC
  Filled 2015-09-18 (×2): qty 2.5

## 2015-09-18 MED ORDER — NITROGLYCERIN 0.4 MG SL SUBL: 0.4000 mg | SUBLINGUAL_TABLET | SUBLINGUAL | Status: DC | PRN

## 2015-09-18 MED ORDER — SODIUM CHLORIDE 0.9% FLUSH
3.0000 mL | Freq: Two times a day (BID) | INTRAVENOUS | Status: DC
  Administered 2015-09-18 – 2015-09-21 (×5): 3 mL via INTRAVENOUS

## 2015-09-18 MED ORDER — ACETAMINOPHEN 650 MG RE SUPP: 650.0000 mg | Freq: Four times a day (QID) | RECTAL | Status: DC | PRN

## 2015-09-18 MED ORDER — ACETAMINOPHEN 325 MG PO TABS
650.0000 mg | ORAL_TABLET | Freq: Once | ORAL | Status: AC
  Administered 2015-09-18: 650 mg via ORAL
  Filled 2015-09-18: qty 2

## 2015-09-18 MED ORDER — SODIUM CHLORIDE 0.9 % IV BOLUS (SEPSIS)
500.0000 mL | Freq: Once | INTRAVENOUS | Status: AC
  Administered 2015-09-18: 500 mL via INTRAVENOUS

## 2015-09-18 MED ORDER — INSULIN ASPART 100 UNIT/ML ~~LOC~~ SOLN
0.0000 [IU] | Freq: Three times a day (TID) | SUBCUTANEOUS | Status: DC
  Administered 2015-09-19 (×3): 3 [IU] via SUBCUTANEOUS
  Administered 2015-09-20 (×2): 2 [IU] via SUBCUTANEOUS
  Administered 2015-09-21: 3 [IU] via SUBCUTANEOUS
  Administered 2015-09-21: 5 [IU] via SUBCUTANEOUS
  Administered 2015-09-21: 2 [IU] via SUBCUTANEOUS
  Administered 2015-09-22: 3 [IU] via SUBCUTANEOUS

## 2015-09-18 MED ORDER — METOPROLOL SUCCINATE ER 25 MG PO TB24
25.0000 mg | ORAL_TABLET | Freq: Every morning | ORAL | Status: DC
  Administered 2015-09-19: 25 mg via ORAL
  Filled 2015-09-18 (×2): qty 1

## 2015-09-18 MED ORDER — DEXTROSE 5 % IV SOLN
1.0000 g | INTRAVENOUS | Status: DC
  Administered 2015-09-18 – 2015-09-19 (×2): 1 g via INTRAVENOUS
  Filled 2015-09-18 (×4): qty 10

## 2015-09-18 MED ORDER — ACETAMINOPHEN 325 MG PO TABS
650.0000 mg | ORAL_TABLET | Freq: Four times a day (QID) | ORAL | Status: DC | PRN
  Administered 2015-09-18 – 2015-09-19 (×2): 650 mg via ORAL
  Filled 2015-09-18 (×2): qty 2

## 2015-09-18 MED ORDER — CLOPIDOGREL BISULFATE 75 MG PO TABS
75.0000 mg | ORAL_TABLET | Freq: Every day | ORAL | Status: DC
  Administered 2015-09-19 – 2015-09-22 (×4): 75 mg via ORAL
  Filled 2015-09-18 (×4): qty 1

## 2015-09-18 MED ORDER — DEXTROSE 5 % IV SOLN
500.0000 mg | INTRAVENOUS | Status: DC
  Administered 2015-09-18: 500 mg via INTRAVENOUS
  Filled 2015-09-18 (×2): qty 500

## 2015-09-18 NOTE — Telephone Encounter (Signed)
New Message:  Pt's daughter called in stating that the pt has been experiencing some chest pain on the left side of her chest. Also some pain in her shoulders and neck. The daughter states that her pulse is 104 and would like for her to be seen today if possible. Please f/u with her

## 2015-09-18 NOTE — ED Notes (Signed)
Pt. Is  Weak, needed assistance with sitting up in the bed.

## 2015-09-18 NOTE — ED Provider Notes (Signed)
A she has vague historian. Planes of intermittent chest pain, cough, subjective fever onset approximately 1 week ago.. Cough nonproductive. On exam not ill-appearing lungs scant rhonchi at right base. No respiratory distress heart regular rate and rhythm  Orlie Dakin, MD 09/18/15 1747

## 2015-09-18 NOTE — ED Notes (Signed)
Patient undressed, in gown, on monitor, continuous pulse oximetry and blood pressure cuff; visitor at bedside 

## 2015-09-18 NOTE — ED Notes (Signed)
Pt reports being seen in dec, had stent placed. Return of chest pain last week, sob, cough. Febrile at triage. Airway intact, ekg done.

## 2015-09-18 NOTE — Telephone Encounter (Signed)
Patient's daughter called concerned because patient has has intermittent sharp CP since last Wednesday. The pain radiates up her neck and down to her L elbow and is accompanied by SOB. She also reports that her heart is racing - HR is 110 and is usually between 70-80.  Patient had stent placed in December.  Caller is anxious and very worried about symptoms.  Instructed caller to take patient to ED for evaluation.  She agrees with treatment plan.

## 2015-09-18 NOTE — ED Notes (Signed)
Assisted Cecille Rubin, RN with rectal temperature on patient; visitor at bedside

## 2015-09-18 NOTE — ED Notes (Signed)
Updated pt. And family on plan of care,. Informed her that she will be going to her room [address] 18. Pt. Is in no distress, No Sob noted.

## 2015-09-18 NOTE — ED Notes (Signed)
Spoke with [facility] she will call the kitchen and send her dinner tray to [address]

## 2015-09-18 NOTE — Consult Note (Addendum)
Pharmacy Antibiotic Note  Brandi Frey is a 75 y.o. female admitted on 09/18/2015 with suspected community acquired pneumonia and sepsis.  Pharmacy has been consulted for ceftriaxone and azithromycin dosing.  Plan: Ceftriaxone 1g IV q24h Azithromycin 500 mg IV q24h  Height: 5\' 5"  (165.1 cm) Weight: 154 lb (69.854 kg) IBW/kg (Calculated) : 57  Temp (24hrs), Avg:100.5 F (38.1 C), Min:100.1 F (37.8 C), Max:100.9 F (38.3 C)   Recent Labs Lab 09/18/15 1119 09/18/15 1125  WBC  --  13.1*  CREATININE  --  0.76  LATICACIDVEN 2.18*  --     Estimated Creatinine Clearance: 62.4 mL/min (by C-G formula based on Cr of 0.76).    Allergies  Allergen Reactions  . Adhesive [Tape] Other (See Comments) and Rash    BLISTERING  . Latex Other (See Comments)    BLISTERING  . Metformin Diarrhea    Can take but bothers stomach at time    Thank you for allowing pharmacy to be a part of this patient's care. Pharmacy will sign off as no renal adjustment is necessary.  Joya San, PharmD Clinical Pharmacy Resident Pager # [phone] 09/18/2015 2:24 PM  Pharmacy Code Sepsis Protocol  Time of code sepsis page: 14:22 []  Antibiotics delivered at 14:28  Were antibiotics ordered at the time of the code sepsis page? Yes Was it required to contact the physician? [x]  Physician not contacted []  Physician contacted to order antibiotics for code sepsis []  Physician contacted to recommend changing antibiotics  Pharmacy consulted for: ceftriaxone/azithro  Anti-infectives    Start     Dose/Rate Route Frequency Ordered Stop   09/18/15 1430  cefTRIAXone (ROCEPHIN) 1 g in dextrose 5 % 50 mL IVPB     1 g 100 mL/hr over 30 Minutes Intravenous Every 24 hours 09/18/15 1421     09/18/15 1430  azithromycin (ZITHROMAX) 500 mg in dextrose 5 % 250 mL IVPB     500 mg 250 mL/hr over 60 Minutes Intravenous Every 24 hours 09/18/15 1421          Nurse education provided: [x]  Minutes left to administer  antibiotics to achieve 1 hour goal [x]  Correct order of antibiotic administration [x]  Antibiotic Y-site compatibilities     Joya San, PharmD Clinical Pharmacy Resident Pager # [phone] 09/18/2015 2:31 PM

## 2015-09-18 NOTE — ED Notes (Addendum)
Crackers and ginger ale given.

## 2015-09-18 NOTE — ED Provider Notes (Signed)
CSN: [id number]     Arrival date & time 09/18/15  1047 History   First MD Initiated Contact with Patient 09/18/15 1346     Chief Complaint  Patient presents with  . Chest Pain  . Shortness of Breath   (Consider location/radiation/quality/duration/timing/severity/associated sxs/prior Treatment) Patient is a 75 y.o. female presenting with chest pain and shortness of breath. The history is provided by the patient and a relative. No language interpreter was used.  Chest Pain Associated symptoms: cough and shortness of breath   Associated symptoms: no abdominal pain, no fever and not vomiting   Shortness of Breath Associated symptoms: chest pain and cough   Associated symptoms: no abdominal pain, no fever and no vomiting   Brandi Frey is a 75 y.o female with a history of drug-eluting stent in the LAD, hypertension, hyperlipidemia, diabetes, SVT, PAF who presents with sudden onset, constant and midsternal chest pain after moving an object and intermittent shortness of breath 1 week. Pain does not radiate. She has also had a nonproductive cough times one week. Reports chills. Normally very self-sufficient and drives. Daughter states that she brought her in due to the chest pain. She denies taking any pain medications. He is currently on Eliquis and plavix.  She denies any fever, night sweats, nausea, vomiting, diarrhea, dysuria, hematuria, or urinary frequency.  Past Medical History  Diagnosis Date  . Hypertension   . Hyperlipidemia   . Coronary artery disease     a. 2009: DES x2 to LAD in [facility]   b. 07/2015: NSTEMI s/p DES to LAD.  . Diabetes mellitus, type 2 ([place])   . SVT (supraventricular tachycardia) ([place])   . PAF (paroxysmal atrial fibrillation) ([place])     a. newly diagnosed 07/2015 in the setting of NSTEMI. Spont conv to NSR/SB. Started on Eliquis.  . Mitral regurgitation    Past Surgical History  Procedure Laterality Date  . Cardiac catheterization    . Vaginal hysterectomy    .  Coronary angioplasty  2009    LAD x 2 with DES  . Cardiac catheterization N/A 08/07/2015    Procedure: Left Heart Cath and Coronary Angiography;  Surgeon: Burnell Blanks, MD;  Location: [place] [facility];  Service: Cardiovascular;  Laterality: N/A;  . Cardiac catheterization N/A 08/07/2015    Procedure: Coronary Stent Intervention;  Surgeon: Burnell Blanks, MD;  Location: [place] [facility];  Service: Cardiovascular;  Laterality: N/A;  mid LAD   Family History  Problem Relation Age of Onset  . Ovarian cancer Mother   . CVA Father   . Heart disease Brother   . Heart attack Brother   . Stroke Father   . Hypertension Sister   . Diabetes Brother    Social History  Substance Use Topics  . Smoking status: Never Smoker   . Smokeless tobacco: Never Used  . Alcohol Use: Yes   OB History    No data available     Review of Systems  Constitutional: Positive for chills. Negative for fever.  Respiratory: Positive for cough and shortness of breath.   Cardiovascular: Positive for chest pain.  Gastrointestinal: Negative for vomiting, abdominal pain and diarrhea.  All other systems reviewed and are negative.     Allergies  Adhesive; Latex; and Metformin  Home Medications   Prior to Admission medications   Medication Sig Start Date End Date Taking? Authorizing Provider  acetaminophen (TYLENOL) 500 MG tablet Take 1,000 mg by mouth every 6 (six) hours as needed for  moderate pain.   Yes Historical Provider, MD  apixaban (ELIQUIS) 5 MG TABS tablet Take 1 tablet (5 mg total) by mouth 2 (two) times daily. 08/08/15  Yes Eileen Stanford, PA-C  atorvastatin (LIPITOR) 80 MG tablet Take 40 mg by mouth daily.   Yes Historical Provider, MD  Cinnamon 500 MG TABS Take 1,000 mg by mouth 2 (two) times daily.   Yes Historical Provider, MD  clopidogrel (PLAVIX) 75 MG tablet Take 1 tablet (75 mg total) by mouth daily with breakfast. 08/08/15  Yes Eileen Stanford, PA-C  metFORMIN  (GLUCOPHAGE) 500 MG tablet Take 1 tablet (500 mg total) by mouth daily after breakfast. 08/08/15  Yes Eileen Stanford, PA-C  metoprolol succinate (TOPROL-XL) 25 MG 24 hr tablet Take 25 mg by mouth every morning. 07/29/15  Yes Historical Provider, MD  Travoprost, BAK Free, (TRAVATAN) 0.004 % SOLN ophthalmic solution Place 1 drop into both eyes at bedtime.   Yes Historical Provider, MD  aspirin EC 81 MG tablet Take the last dose 09/07/15 08/14/15   Dayna N Dunn, PA-C  nitroGLYCERIN (NITROSTAT) 0.4 MG SL tablet Place 1 tablet (0.4 mg total) under the tongue every 5 (five) minutes x 3 doses as needed for chest pain. 08/08/15   Eileen Stanford, PA-C   BP 122/70 mmHg  Pulse 81  Temp(Src) 99.4 F (37.4 C) (Oral)  Resp 20  Ht 5\' 5"  (1.651 m)  Wt 69.854 kg  BMI 25.63 kg/m2  SpO2 93% Physical Exam  Constitutional: She is oriented to person, place, and time. She appears well-developed and well-nourished.  Diaphoretic and clammy.  HENT:  Head: Normocephalic and atraumatic.  Eyes: Conjunctivae are normal.  Neck: Normal range of motion. Neck supple.  Cardiovascular: Normal rate and regular rhythm.   No murmur heard. RRR. No murmur appreciated.  Pulmonary/Chest: Effort normal and breath sounds normal. No respiratory distress. She has no wheezes. She has no rales.  Lungs clear to auscultation bilaterally.  No wheezing or decreased breath sounds.   Abdominal: Soft. There is no tenderness.  Soft and nontender.  Non distended.   Musculoskeletal: Normal range of motion. She exhibits no edema or tenderness.  No calf edema or tenderness. 2+ DP pulse.  Neurological: She is alert and oriented to person, place, and time.  Skin: Skin is warm and dry.  Psychiatric: She has a normal mood and affect.  Nursing note and vitals reviewed.   ED Course  Procedures (including critical care time) Labs Review Labs Reviewed  BASIC METABOLIC PANEL - Abnormal; Notable for the following:    Chloride 100 (*)     Glucose, Bld 253 (*)    All other components within normal limits  CBC - Abnormal; Notable for the following:    WBC 13.1 (*)    Hemoglobin 11.5 (*)    HCT 35.7 (*)    All other components within normal limits  I-STAT CG4 LACTIC ACID, ED - Abnormal; Notable for the following:    Lactic Acid, Venous 2.18 (*)    All other components within normal limits  CULTURE, BLOOD (ROUTINE X 2)  CULTURE, BLOOD (ROUTINE X 2)  URINE CULTURE  URINALYSIS, ROUTINE W REFLEX MICROSCOPIC (NOT AT [facility])  I-STAT TROPOININ, ED  I-STAT CG4 LACTIC ACID, ED  I-STAT CG4 LACTIC ACID, ED    Imaging Review Dg Chest 2 View  09/18/2015  EXAM: CHEST  2 VIEW COMPARISON:  None. FINDINGS: Normal cardiac silhouette. There is LEFT basilar atelectasis small effusion. RIGHT lung is  clear. Upper lobes clear. IMPRESSION: LEFT basilar atelectasis and effusion. Recommend follow-up radiographs (4 to 6 weeks) to demonstrate resolution of unilateral pleural effusion versus further evaluation with contrast CT. Electronically Signed   By: Suzy Bouchard M.D.   On: 09/18/2015 11:56   I have personally reviewed and evaluated these images and lab results as part of my medical decision-making.   EKG Interpretation   Date/Time:  Wednesday September 18 2015 10:53:41 EST Ventricular Rate:  98 PR Interval:  134 QRS Duration: 78 QT Interval:  338 QTC Calculation: 431 R Axis:   59 Text Interpretation:  Normal sinus rhythm Low voltage QRS Nonspecific T  wave abnormality Abnormal ECG SINCE LAST TRACING HEART RATE HAS INCREASED  Confirmed by Winfred Leeds  MD, SAM [phone]) on 09/18/2015 11:42:56 AM      MDM   Final diagnoses:  Chest pain, unspecified chest pain type  Community acquired pneumonia  Shortness of breath   Patient presents for chest pain and sob x 1 week.  Has non productive cough. Patient had temperature of 100.1 on arrival to ED. She had tachypnea and lactic acid above 2. Code sepsis was called. She was given Tylenol and a  bolus of fluids. Patient has a white count of 13, hemoglobin 11.5 but otherwise labs are concerning. Troponin is negative. She has no urinary symptoms. EKG not concerning. Chest x-ray shows left basilar atelectasis and effusion. Patient denies any urinary symptoms. This is most likely community-acquired pneumonia. Patient was started on azithromycin and ceftriaxone. Medications  cefTRIAXone (ROCEPHIN) 1 g in dextrose 5 % 50 mL IVPB (0 g Intravenous Stopped 09/18/15 1522)  azithromycin (ZITHROMAX) 500 mg in dextrose 5 % 250 mL IVPB (500 mg Intravenous New Bag/Given 09/18/15 1446)  acetaminophen (TYLENOL) tablet 650 mg (650 mg Oral Given 09/18/15 1304)  sodium chloride 0.9 % bolus 500 mL (0 mLs Intravenous Stopped 09/18/15 1346)   Filed Vitals:   09/18/15 1515 09/18/15 1523  BP: 122/70   Pulse: 81   Temp:  99.4 F (37.4 C)  Resp: 20    I discussed this patient with the attending for Dr. Gwendlyn Deutscher. Plan is to admit to telemetry. Patient is currently stable and in no acute respiratory distress. Running in the low 90s on room air.     Ottie Glazier, PA-C 09/18/15 1559  Orlie Dakin, MD 09/18/15 [phone]

## 2015-09-18 NOTE — Progress Notes (Signed)
Brandi Frey [id number] Admission Data: 09/18/2015 6:42 PM Attending Provider: Kinnie Feil, MD  [id number], MD Consults/ Treatment Team: Treatment Team:  Rounding Lbcardiology, MD  Brandi Frey is a 75 y.o. female patient admitted from ED awake, alert  & orientated  X 4,  DNR, VSS - Blood pressure 125/66, pulse 49, temperature 98.8 F (37.1 C), temperature source Oral, resp. rate 20, height 5\' 5"  (1.651 m), weight 69.854 kg (154 lb), SpO2 98 %., Room Air,  no c/o shortness of breath, no c/o chest pain, no distress noted. Tele # [id number]  Placed.   IV site WDL: Right wrist and hand with a transparent dsg that's clean dry and intact. Right wrist running [facility] at [facility].   Allergies:   Allergies  Allergen Reactions  . Adhesive [Tape] Other (See Comments) and Rash    BLISTERING  . Latex Other (See Comments)    BLISTERING  . Metformin Diarrhea    Can take but bothers stomach at time     Past Medical History  Diagnosis Date  . Hypertension   . Hyperlipidemia   . Coronary artery disease     a. 2009: DES x2 to LAD in [facility]   b. 07/2015: NSTEMI s/p DES to LAD.  . Diabetes mellitus, type 2 ([place])   . SVT (supraventricular tachycardia) ([place])   . PAF (paroxysmal atrial fibrillation) ([place])     a. newly diagnosed 07/2015 in the setting of NSTEMI. Spont conv to NSR/SB. Started on Eliquis.  . Mitral regurgitation    Pt orientation to unit, room and routine. Information packet given to patient/family and safety video watched.  Admission INP armband ID verified with patient/family, and in place. SR up x 2, fall risk assessment complete with Patient and family verbalizing understanding of risks associated with falls. Pt verbalizes an understanding of how to use the call bell and to call for help before getting out of bed.  Skin, clean-dry- intact without evidence of bruising, or skin tears.   No evidence of skin break down noted on exam. Redness noted to buttocks but is  blanchable. Skin assessment done with Vincente Liberty RN.   Will cont to monitor and assist as needed.  Brandi Fray, RN 09/18/2015 6:59 PM

## 2015-09-18 NOTE — Consult Note (Signed)
CARDIOLOGY CONSULT NOTE   Patient ID: Brandi Frey MRN: [id number], DOB/AGE: June 16, 1941   Admit date: 09/18/2015 Date of Consult: 09/18/2015   Primary Physician: [name], MD Primary Cardiologist: [name]  Pt. Profile  Brandi Frey is a 75 year old female with past medical history of HTN, HLD, DM, CAD s/p DESx2 to LAD in 2009 and recent DES to LAD in 07/2015, mod MR by echo 07/2015, and recently diagnosed atrial fibrillation and SVT who presented with fever, chill, nonproductive cough and intermittent chest pain worse when she laying down and better when she sits up since last week.  Problem List  Past Medical History  Diagnosis Date  . Hypertension   . Hyperlipidemia   . Coronary artery disease     a. 2009: DES x2 to LAD in [facility]   b. 07/2015: NSTEMI s/p DES to LAD.  . Diabetes mellitus, type 2 ([place])   . SVT (supraventricular tachycardia) ([place])   . PAF (paroxysmal atrial fibrillation) ([place])     a. newly diagnosed 07/2015 in the setting of NSTEMI. Spont conv to NSR/SB. Started on Eliquis.  . Mitral regurgitation     Past Surgical History  Procedure Laterality Date  . Cardiac catheterization    . Vaginal hysterectomy    . Coronary angioplasty  2009    LAD x 2 with DES  . Cardiac catheterization N/A 08/07/2015    Procedure: Left Heart Cath and Coronary Angiography;  Surgeon: Burnell Blanks, MD;  Location: [place] [facility];  Service: Cardiovascular;  Laterality: N/A;  . Cardiac catheterization N/A 08/07/2015    Procedure: Coronary Stent Intervention;  Surgeon: Burnell Blanks, MD;  Location: [place] [facility];  Service: Cardiovascular;  Laterality: N/A;  mid LAD     Allergies  Allergies  Allergen Reactions  . Adhesive [Tape] Other (See Comments) and Rash    BLISTERING  . Latex Other (See Comments)    BLISTERING  . Metformin Diarrhea    Can take but bothers stomach at time    HPI   Mrs. Brandi Frey is a 75 year old female with  past medical history of HTN, HLD, DM, CAD s/p DESx2 to LAD in 2009 and recent DES to LAD in 07/2015, mod MR by echo 07/2015, and recently diagnosed atrial fibrillation and SVT. She was admitted to [facility] in December with chest pain, NSTEMI, A. fib RVR and SVT. She had workup 2 weeks prior to that admission at [facility] for chest pain in mid December. She had CT of the chest which ruled out PE. They could not do cardiac catheterization due to declining renal function. She had a stress test which according to the family was normal. She had recurrence of her symptom on December 27 with EKG showing SVT heart rate 190s. She was given IV adenosine and Cardizem, her rate slowed into atrial fibrillation and subsequently converted to sinus bradycardia. Cardizem was later discontinued due to hypotension and bradycardia. She underwent left heart cath on 08/07/2015 which showed mild in-stent restenosis of previous LAD stent, 99% mid LAD stenosis beyond diagonal branch treated with drug-eluting stent. It was recommended she continue aspirin, Plavix and eliquis for one month and then dropped aspirin and continue Plavix and eliquis. Echocardiogram obtained on 11/28/2014 showed EF 55-60%, mitral annular calcification with restricted posterior leaflet motion, moderate MR, moderately dilated left atrium, peak PA pressure 38. She was last seen in the office on 08/14/2015 at which time she was doing well.  For the last week, she has been  having intermittent chest pain after lifting objects. Initially she thought she pulled a muscle, however the symptom persisted. She also noticed her symptom is worse when she is laying on her back and better when she is sitting up. She notices the symptom is also worse when she is standing up and trying to bend over. She also endorsed fever, chill, occasional palpitation, and shortness of breath. She has been having some nonproductive cough. Eventually she told her family who encouraged her  to seek medical attention at [facility]. On arrival she has a temperature of 100.9. Initial lactic acid was 2.18. BNP was normal. CBC shows elevated white blood cell count of 13.1. Hemoglobin 11.5. Blood culture were obtained. Chest x-ray shows left basilar atelectasis and effusion. CTA of the chest showed moderate size pericardial effusion which is new compared to the previous echo, bilateral pleural effusion left greater than right.    No current facility-administered medications on file prior to encounter.   Current Outpatient Prescriptions on File Prior to Encounter  Medication Sig Dispense Refill  . acetaminophen (TYLENOL) 500 MG tablet Take 1,000 mg by mouth every 6 (six) hours as needed for moderate pain.    Marland Kitchen apixaban (ELIQUIS) 5 MG TABS tablet Take 1 tablet (5 mg total) by mouth 2 (two) times daily. 60 tablet 6  . atorvastatin (LIPITOR) 80 MG tablet Take 40 mg by mouth daily.    . Cinnamon 500 MG TABS Take 1,000 mg by mouth 2 (two) times daily.    . clopidogrel (PLAVIX) 75 MG tablet Take 1 tablet (75 mg total) by mouth daily with breakfast. 30 tablet 12  . metFORMIN (GLUCOPHAGE) 500 MG tablet Take 1 tablet (500 mg total) by mouth daily after breakfast. 30 tablet 1  . metoprolol succinate (TOPROL-XL) 25 MG 24 hr tablet Take 25 mg by mouth every morning.  99  . Travoprost, BAK Free, (TRAVATAN) 0.004 % SOLN ophthalmic solution Place 1 drop into both eyes at bedtime.    Marland Kitchen aspirin EC 81 MG tablet Take the last dose 09/07/15    . nitroGLYCERIN (NITROSTAT) 0.4 MG SL tablet Place 1 tablet (0.4 mg total) under the tongue every 5 (five) minutes x 3 doses as needed for chest pain. 25 tablet 11        Family History Family History  Problem Relation Age of Onset  . Ovarian cancer Mother   . CVA Father   . Heart disease Brother   . Heart attack Brother   . Stroke Father   . Hypertension Sister   . Diabetes Brother      Social History Social History   Social History  .  Marital Status: Divorced    Spouse Name: N/A  . Number of Children: N/A  . Years of Education: N/A   Occupational History  . Not on file.   Social History Main Topics  . Smoking status: Never Smoker   . Smokeless tobacco: Never Used  . Alcohol Use: Yes  . Drug Use: No  . Sexual Activity: Not on file   Other Topics Concern  . Not on file   Social History Narrative     Review of Systems  General:  No night sweats or weight changes.  +chills, fever Cardiovascular:  No dyspnea on exertion, edema, orthopnea, palpitations, paroxysmal nocturnal dyspnea. +chest pain with deep inspiration and laying down Dermatological: No rash, lesions/masses Respiratory: +nonproductive cough, dyspnea Urologic: No hematuria, dysuria Abdominal:   No nausea, vomiting, diarrhea, bright red blood per  rectum, melena, or hematemesis Neurologic:  No visual changes, wkns, changes in mental status. All other systems reviewed and are otherwise negative except as noted above.  Physical Exam  Blood pressure 131/70, pulse 82, temperature 99.4 F (37.4 C), temperature source Oral, resp. rate 27, height 5\' 5"  (1.651 m), weight 154 lb (69.854 kg), SpO2 94 %.  General: Pleasant, NAD Psych: Normal affect. Neuro: Alert and oriented X 3. Moves all extremities spontaneously. HEENT: Normal  Neck: Supple without bruits or JVD. Lungs:  Resp regular and unlabored, CTA. Heart: RRR no s3, s4, or murmurs.  No obvious pericardial rub Abdomen: Soft, non-tender, non-distended, BS + x 4.  Extremities: No clubbing, cyanosis or edema. DP/PT/Radials 2+ and equal bilaterally.  Labs  No results for input(s): CKTOTAL, CKMB, TROPONINI in the last 72 hours. Lab Results  Component Value Date   WBC 13.1* 09/18/2015   HGB 11.5* 09/18/2015   HCT 35.7* 09/18/2015   MCV 84.6 09/18/2015   PLT 209 09/18/2015    Recent Labs Lab 09/18/15 1125  NA 136  K 4.4  CL 100*  CO2 24  BUN 10  CREATININE 0.76  CALCIUM 9.3  GLUCOSE  253*   No results found for: CHOL, HDL, LDLCALC, TRIG No results found for: DDIMER  Radiology/Studies  Dg Chest 2 View  09/18/2015  EXAM: CHEST  2 VIEW COMPARISON:  None. FINDINGS: Normal cardiac silhouette. There is LEFT basilar atelectasis small effusion. RIGHT lung is clear. Upper lobes clear. IMPRESSION: LEFT basilar atelectasis and effusion. Recommend follow-up radiographs (4 to 6 weeks) to demonstrate resolution of unilateral pleural effusion versus further evaluation with contrast CT. Electronically Signed   By: Suzy Bouchard M.D.   On: 09/18/2015 11:56   Ct Chest Wo Contrast  09/18/2015  ADDENDUM REPORT: 09/18/2015 16:44 ADDENDUM: These results were called by telephone on 09/18/2015 at 4:35 pm to Dr. Lanetta Inch, who verbally acknowledged these results. Electronically Signed   By: Franki Cabot M.D.   On: 09/18/2015 16:44  09/18/2015  CLINICAL DATA:  Right-sided chest pain and shortness breath for 1 week. EXAM: CT CHEST WITHOUT CONTRAST TECHNIQUE: Multidetector CT imaging of the chest was performed following the standard protocol without IV contrast. COMPARISON:  None. FINDINGS: Mediastinum/Lymph Nodes: There is a moderate-sized pericardial effusion, measuring 2 cm greatest thickness both anteriorly and posteriorly. Overall, there is cardiomegaly which is mild to moderate in degree. Appears to be a stent within the left anterior descending coronary artery. Suspect additional coronary artery calcifications within the more proximal LAD. Scattered mild atherosclerotic calcifications noted along the walls of the normal-caliber thoracic aorta. Scattered small lymph nodes within the mediastinum. No masses or enlarged lymph nodes within the mediastinum or perihilar regions. Lungs/Pleura: Moderate-sized left pleural effusion with adjacent compressive atelectasis. Smaller right pleural effusion with adjacent atelectasis. Upper lungs are clear. Trachea and central bronchi are unremarkable. Upper abdomen: Limited  images of the upper abdomen are unremarkable. Musculoskeletal: No chest wall mass or suspicious bone lesions identified. Mild degenerative change noted within the thoracic spine. IMPRESSION: 1. Pericardial effusion, moderate in size, measuring 2 cm greatest thickness both anteriorly and posteriorly. 2. Cardiomegaly, mild to moderate in degree. Probable stent within the left anterior descending coronary artery. Additional atherosclerotic calcifications involving the more proximal LAD. 3. Bilateral pleural effusions, left greater than right, with associated bibasilar atelectasis. Electronically Signed: By: Franki Cabot M.D. On: 09/18/2015 16:04    ECG  NSR without significant ventricular ectopy  ASSESSMENT AND PLAN  1. Sepsis: per IM  2. Chest pain worse with deep inspiration and laying down  - last EKG showed no significant ST-T wave changes, however her symptom is worse when laying and better when sitting, recommend colchicine to help decrease pericardial effusion and prevent pericarditis.   - obtain echo to reassess EF, valve and pericardial effusion. Trend enzyme.  - I think the chance of stent restenosis after only 3 month on plavix and effient is low, and given how atypical her symptom is, unless EKG change or trop elevation, would not expect any ischemic workup  Addendum: discussed with Dr. Burt Knack, will hold eliquis to avoid hemorrhagic conversion of pericardial effusion. Will not need IV heparin either. SCD. May restart ASA 2 days after stopping eliquis.   3. Moderate pericardial effusion: see #2  4. CAD s/p DESx2 to LAD in 2009 and recent DES to LAD in 07/2015  - Cath 08/07/2015 mild in-stent restenosis of previous LAD stent, 99% mid LAD stenosis beyond that no branch treated with drug-eluting stent  5. HTN  6. HLD  7. DM mod MR by echo 07/2015  8. paroxysmal atrial fibrillation on eliquis  - CHA2DS2-Vasc score 5 (HTN, DM, age, CAD, female)   Signed, Woodward Ku 09/18/2015, 5:27 PM  Patient seen, examined. Available data reviewed. Agree with findings, assessment, and plan as outlined by Almyra Deforest, PA-C. The patient is examined independently with her family at the bedside. She is comfortable and in no distress. JVP is normal. Lungs are clear. She does have clear pain with inspiration. Heart is regular rate and rhythm. There is no murmur or friction rub present. Abdomen is soft and nontender. There is no peripheral edema.  EKG shows no evidence of ST segment elevation or other signs of acute or carditis. Chest CT scan is reviewed and there is a moderate pericardial effusion. There is also a pleural effusion present.  The patient has been anticoagulated with Eliquis for paroxysmal atrial fibrillation. She continues on Plavix after recent stenting of the LAD. She has no history of large MI and in fact her LV function is normal. She does have symptoms of systemic infection and may have pleuropericarditis. Recommend addition of culture seen. Will check an echocardiogram. Would stop  Eliquis for now to avoid bleeding complications or hemorrhagic transformation of her pericardial effusion. Would add aspirin 81 mg tomorrow. Will follow-up tomorrow after her echo is completed. Antibiotics empirically per the primary team.   Sherren Mocha, M.D. 09/18/2015 6:34 PM

## 2015-09-18 NOTE — H&P (Signed)
[facility] Admission History and Physical Service Pager: [phone]  Patient name: Brandi Frey Medical record number: [id number] Date of birth: [birth year] Age: 75 y.o. Gender: female  Primary Care Provider: [name], MD Consultants:  Code Status: DNR   Chief Complaint: Chest pain  Assessment and Plan: Brandi Frey is a 75 y.o. female presenting with chest pain . PMH is significant for HTN, HLD, PAF, T2DM, CAD, SVT.   # Chest Pain: Acute onset of chest pain over the past week, patient indicates pain worse when lying down versus sitting up, increases in intrathoracic pressure such as coughing. Denies pain worsening with exertion, has not tried nitroglycerin. Positive for increased SOB. With a recent history of an placement on December 28 following an NSTEMI. Heart score 5. Troponin negative 1. CT positive for pericardial effusion 2 cm in size bilateral pleural effusions and bibasilar atelectasis. ECHO normal EF on 07/2015. Differential to include ACS vs CAP vs PE vs. Dressler Syndrome. CTA negative for PE, no consolidation or infiltrate noted therefore less likely CAP. Due to recent hx of cardiac injury, possible post-cardiac inflammatory response to the current leukocytosis, febrile, pleuritic chest pain, pericardial effusion, and pleural effusion would consider Dressler syndrome high on differential versus possible ACS  - Will admit to teaching service Dr. Gwendlyn Deutscher  - Consult cardiology for recs - Will get ECHO  -Trend troponins 3 and AM EKG  - Vitals per floor, telemetry and pulse oximetry   #SIRS Criteria: On admission tacypenic, WBC 13.0, febrile (100.9), and slight lactic acidosis at 2.18>0.75. Concern for CAP, therefore ED started patient on azithromycin and ceftriaxone. However, no infiltrate or consolidation noted on CT or CXR making CAP unlikely. Received 500 cc bolus in ED. - Continue Ceftriaxone  - Will get UA and Urine Culture pending   - Blood cultures pending  - Will continue to follow vitals closely  - AM CBC and BMET   # T2DM A1C 8.0. Currently on Metformin at home  - Holding metformin  - Consider Lipid Panel no previously   # HLD  - Hold Atrovostatin  - Consider getting Lipid panel   # PAF: - Continue Eliquis and Metoprolol XR    # CAD: Drug eluting stent placed in 08/07/2015. Patient on Plavix  - Continue Plavix   FEN/GI: Carb-Modified  Prophylaxis: On Eliquis   Disposition: Telemetry   History of Present Illness:  Brandi Frey is a 75 y.o. female presenting with chest pain. States that pain is mid-sternal with radiation to left neck and left arm and shoulder blade. Has been going on for about 1 week. Patient states the she has had trouble sleeping due to pain--worse with laying flat or lying on left side. Pain is worse with movement, cough or increased pressure with bending forward. Pain improves when patient is sitting up. Can't take a deep breaths or cough without increased pain Denies congestion. Denies denies exertional pain. Has not tried nitroglycerin as she thought it was a pulled muscle. Patient has had decreased appetite body aches and fevers and chills for 2-3 days. No sick contacts however per daughter recent flu epidemic at school that grandson goes to. she has had the flu shot this year. States that chest pain is similar to prior heart attack patient had in December where stent was placed.   Review Of Systems: Per HPI with the following additions: Review of Systems  Constitutional: Positive for chills and malaise/fatigue. Negative for fever.  HENT: Negative for congestion and sore throat.  Respiratory: Positive for shortness of breath. Negative for cough and sputum production.   Cardiovascular: Positive for chest pain, palpitations and orthopnea. Negative for leg swelling.  Gastrointestinal: Negative for nausea, vomiting, abdominal pain, diarrhea and blood in stool.  Genitourinary: Negative  for dysuria and urgency.  Musculoskeletal: Positive for myalgias.  Neurological: Negative for dizziness, tingling and focal weakness.   Otherwise the remainder of the systems were negative.  Patient Active Problem List   Diagnosis Date Noted  . CAP (community acquired pneumonia) 09/18/2015  . Hypertension   . Hyperlipidemia   . PAF (paroxysmal atrial fibrillation) ([place])   . Diabetes mellitus, type 2 ([place])   . Coronary artery disease   . SVT (supraventricular tachycardia) ([place])   . NSTEMI (non-ST elevated myocardial infarction) [facility])     Past Medical History: Past Medical History  Diagnosis Date  . Hypertension   . Hyperlipidemia   . Coronary artery disease     a. 2009: DES [facility] LAD in [facility]   b. 07/2015: NSTEMI s/p DES to LAD.  . Diabetes mellitus, type 2 ([place])   . SVT (supraventricular tachycardia) ([place])   . PAF (paroxysmal atrial fibrillation) ([place])     a. newly diagnosed 07/2015 in the setting of NSTEMI. Spont conv to NSR/SB. Started on Eliquis.  . Mitral regurgitation     Past Surgical History: Past Surgical History  Procedure Laterality Date  . Cardiac catheterization    . Vaginal hysterectomy    . Coronary angioplasty  2009    LAD x 2 with DES  . Cardiac catheterization N/A 08/07/2015    Procedure: Left Heart Cath and Coronary Angiography;  Surgeon: Burnell Blanks, MD;  Location: [place] [facility];  Service: Cardiovascular;  Laterality: N/A;  . Cardiac catheterization N/A 08/07/2015    Procedure: Coronary Stent Intervention;  Surgeon: Burnell Blanks, MD;  Location: [place] [facility];  Service: Cardiovascular;  Laterality: N/A;  mid LAD    Social History: Social History  Substance Use Topics  . Smoking status: Never Smoker   . Smokeless tobacco: Never Used  . Alcohol Use: Yes   Additional social history: none  Please also refer to relevant sections of EMR.  Family History: Family History  Problem Relation Age of Onset  . Ovarian  cancer Mother   . CVA Father   . Heart disease Brother   . Heart attack Brother   . Stroke Father   . Hypertension Sister   . Diabetes Brother     Allergies and Medications: Allergies  Allergen Reactions  . Adhesive [Tape] Other (See Comments) and Rash    BLISTERING  . Latex Other (See Comments)    BLISTERING  . Metformin Diarrhea    Can take but bothers stomach at time   No current facility-administered medications on file prior to encounter.   Current Outpatient Prescriptions on File Prior to Encounter  Medication Sig Dispense Refill  . acetaminophen (TYLENOL) 500 MG tablet Take 1,000 mg by mouth every 6 (six) hours as needed for moderate pain.    Marland Kitchen apixaban (ELIQUIS) 5 MG TABS tablet Take 1 tablet (5 mg total) by mouth 2 (two) times daily. 60 tablet 6  . atorvastatin (LIPITOR) 80 MG tablet Take 40 mg by mouth daily.    . Cinnamon 500 MG TABS Take 1,000 mg by mouth 2 (two) times daily.    . clopidogrel (PLAVIX) 75 MG tablet Take 1 tablet (75 mg total) by mouth daily with breakfast. 30 tablet 12  .  metFORMIN (GLUCOPHAGE) 500 MG tablet Take 1 tablet (500 mg total) by mouth daily after breakfast. 30 tablet 1  . metoprolol succinate (TOPROL-XL) 25 MG 24 hr tablet Take 25 mg by mouth every morning.  99  . Travoprost, BAK Free, (TRAVATAN) 0.004 % SOLN ophthalmic solution Place 1 drop into both eyes at bedtime.    Marland Kitchen aspirin EC 81 MG tablet Take the last dose 09/07/15    . nitroGLYCERIN (NITROSTAT) 0.4 MG SL tablet Place 1 tablet (0.4 mg total) under the tongue every 5 (five) minutes x 3 doses as needed for chest pain. 25 tablet 11    Objective: BP 131/70 mmHg  Pulse 82  Temp(Src) 99.4 F (37.4 C) (Oral)  Resp 27  Ht 5\' 5"  (1.651 m)  Wt 154 lb (69.854 kg)  BMI 25.63 kg/m2  SpO2 94% Exam: General: Sitting up in bed, NAD  Eyes: PERRL, EOMI  ENTM: Moist mucosa membranes, no nasal congestion Neck: No lymphadenopathy, no thyromegaly  Cardiovascular: RRR, no murmurs, rubs or  gallops  Respiratory: bibasilar crackles otherwise CTAB, slight inspiratory wheezing Abdomen: BS+, no ttp, no rebound  MSK: No lower extremity edema  Skin: no sores or suspicious lesions or rashes or color changes Neuro: Equal strength in upper and lower extremities and equal sensation  Psych: Alert and orientated,  Cognition and judgment appear intact.   Labs and Imaging: CBC BMET   Recent Labs Lab 09/18/15 1125  WBC 13.1*  HGB 11.5*  HCT 35.7*  PLT 209    Recent Labs Lab 09/18/15 1125  NA 136  K 4.4  CL 100*  CO2 24  BUN 10  CREATININE 0.76  GLUCOSE 253*  CALCIUM 9.3     I-Trop 0.0   EKG: No ST elevation or depression  Lactic acid 2.18> 0.75     Caelyn Route Cletis Media, MD 09/18/2015, 4:29 PM PGY-1, [place] Intern pager: [phone], text pages welcome

## 2015-09-18 NOTE — ED Notes (Signed)
Pt. Is aware of needing an urine specimen 

## 2015-09-19 ENCOUNTER — Inpatient Hospital Stay (HOSPITAL_COMMUNITY): Payer: Medicare Other

## 2015-09-19 DIAGNOSIS — R079 Chest pain, unspecified: Secondary | ICD-10-CM

## 2015-09-19 LAB — BASIC METABOLIC PANEL
Anion gap: 12 (ref 5–15)
BUN: 9 mg/dL (ref 6–20)
CALCIUM: 8.8 mg/dL — AB (ref 8.9–10.3)
CO2: 23 mmol/L (ref 22–32)
CREATININE: 0.73 mg/dL (ref 0.44–1.00)
Chloride: 100 mmol/L — ABNORMAL LOW (ref 101–111)
Glucose, Bld: 217 mg/dL — ABNORMAL HIGH (ref 65–99)
Potassium: 4.1 mmol/L (ref 3.5–5.1)
SODIUM: 135 mmol/L (ref 135–145)

## 2015-09-19 LAB — CBC
HCT: 34 % — ABNORMAL LOW (ref 36.0–46.0)
Hemoglobin: 11.3 g/dL — ABNORMAL LOW (ref 12.0–15.0)
MCH: 28 pg (ref 26.0–34.0)
MCHC: 33.2 g/dL (ref 30.0–36.0)
MCV: 84.4 fL (ref 78.0–100.0)
PLATELETS: 215 10*3/uL (ref 150–400)
RBC: 4.03 MIL/uL (ref 3.87–5.11)
RDW: 13.6 % (ref 11.5–15.5)
WBC: 14.6 10*3/uL — AB (ref 4.0–10.5)

## 2015-09-19 LAB — MRSA PCR SCREENING: MRSA BY PCR: NEGATIVE

## 2015-09-19 LAB — GLUCOSE, CAPILLARY
GLUCOSE-CAPILLARY: 177 mg/dL — AB (ref 65–99)
GLUCOSE-CAPILLARY: 215 mg/dL — AB (ref 65–99)
Glucose-Capillary: 225 mg/dL — ABNORMAL HIGH (ref 65–99)
Glucose-Capillary: 351 mg/dL — ABNORMAL HIGH (ref 65–99)

## 2015-09-19 LAB — C-REACTIVE PROTEIN: CRP: 25.9 mg/dL — AB (ref <1.0)

## 2015-09-19 LAB — TROPONIN I
TROPONIN I: 0.03 ng/mL (ref <0.031)
Troponin I: 0.03 ng/mL (ref <0.031)

## 2015-09-19 MED ORDER — INSULIN ASPART 100 UNIT/ML ~~LOC~~ SOLN
8.0000 [IU] | Freq: Once | SUBCUTANEOUS | Status: AC
  Administered 2015-09-19: 8 [IU] via SUBCUTANEOUS

## 2015-09-19 MED ORDER — ASPIRIN 81 MG PO CHEW
81.0000 mg | CHEWABLE_TABLET | Freq: Every day | ORAL | Status: DC
  Administered 2015-09-19 – 2015-09-22 (×4): 81 mg via ORAL
  Filled 2015-09-19 (×3): qty 1

## 2015-09-19 MED ORDER — PANTOPRAZOLE SODIUM 40 MG IV SOLR
40.0000 mg | INTRAVENOUS | Status: DC
  Administered 2015-09-19: 40 mg via INTRAVENOUS
  Filled 2015-09-19: qty 40

## 2015-09-19 MED ORDER — ASPIRIN 325 MG PO TABS
325.0000 mg | ORAL_TABLET | Freq: Every day | ORAL | Status: DC
  Administered 2015-09-19: 325 mg via ORAL
  Filled 2015-09-19: qty 1

## 2015-09-19 MED ORDER — IBUPROFEN 200 MG PO TABS
800.0000 mg | ORAL_TABLET | Freq: Three times a day (TID) | ORAL | Status: DC
  Administered 2015-09-19 – 2015-09-22 (×10): 800 mg via ORAL
  Filled 2015-09-19 (×9): qty 4

## 2015-09-19 NOTE — Progress Notes (Signed)
*  PRELIMINARY RESULTS* Echocardiogram 2D Echocardiogram has been performed.  Leavy Cella 09/19/2015, 10:30 AM

## 2015-09-19 NOTE — Progress Notes (Addendum)
FPTS Interim Progress Note  Noted the ECHO results this morning stating  features consistent with tamponade physiology and large pericardial effusion.   O: BP 133/51 mmHg  Pulse 67  Temp(Src) 99.7 F (37.6 C) (Oral)  Resp 18  Ht 5\' 5"  (1.651 m)  Wt 149 lb 14.6 oz (68 kg)  BMI 24.95 kg/m2  SpO2 92%   A/P:  Pericardial Effusion  - Discussed with Attending Dr. Gwendlyn Deutscher  [facility] cardiology and discussed with Dr. Burt Knack, stated that he will go evaluate the patient and decide if there is need for drainage of pericardial effusion.  - Stat vitals on patient  - Transfer to step down  - Paged IR to notify about the presence of patient   Rayn Shorb Cletis Media, MD 09/19/2015, 11:13 AM PGY-1, [place] Medicine Service pager [phone]

## 2015-09-19 NOTE — Progress Notes (Signed)
Pt is soaked in sweat. Pt is alert and oriented*4. Vitals signs are all WNL. Blood sugar is 351 with no night coverage. MD notified. Will continue to monitor.

## 2015-09-19 NOTE — Progress Notes (Signed)
Chaplain completed Spiritual Care Consult per request.  Spiritual care support was also offered on behalf of the patient, and two daughter present at the time of this visit.  The patient and family shared their desires for total healing, prayer and faith are a part of the patient's continued healing. The family is very supportive and will be a part of the healing process for the patient.  A prayer of healing and wholeness was offered.  Chaplain will follow up as needed. Chaplain Yaakov Guthrie [phone]

## 2015-09-19 NOTE — Progress Notes (Signed)
Family Medicine Teaching Service Daily Progress Note Intern Pager: [phone]  Patient name: Brandi Frey Medical record number: [id number] Date of birth: 1941/07/30 Age: 75 y.o. Gender: female  Primary Care Provider: [name], MD Consultants: Cardiology  Code Status: DNR  Pt Overview and Major Events to Date:    Assessment and Plan: Brandi Frey is a 75 y.o. female presenting with chest pain . PMH is significant for HTN, HLD, PAF, T2DM, CAD, SVT.   # Chest Pain:  Improved. Chest pain improved from yesterday. Only chest pain present with cough and when patient gets up to go to the bathroom. No worsening SOB. Recent hx of stent placement on December 28 due NSTEMI. Heart score 5. Troponin negative 1.CT positive for pericardial effusion 2 cm in size bilateral pleural effusions and bibasilar atelectasis. ECHO normal EF on 07/2015. Differential to include ACS vs CAP vs PE vs. Dressler Syndrome. CTA negative for PE, no consolidation or infiltrate noted therefore less likely CAP. Due to recent hx of cardiac injury, possible post-cardiac inflammatory response due to current leukocytosis, febrile, pleuritic chest pain, pericardial effusion, and pleural effusion would consider Dressler syndrome versus possible ACS  - Will get ECHO  -Trend troponins 0.03>0.03>0.03  - AM EKG, will get repeat EKG  - Follow up cardiology recs, stopped Eliquis per cardiology, started aspirin 81 mg  - Vitals per floor, telemetry and pulse oximetry  - Consider autoimmune process with RH, ANA, and CRP  #SIRS Criteria: Meeting SIRS overnight with 101.1 and WBC 14.6. Concern for CAP, therefore ED started patient on azithromycin and ceftriaxone. However, no infiltrate or consolidation noted on CT or CXR making CAP unlikely. Received 500 cc bolus in ED. - WBC 13> 14.6  - Will get UA (Leukocytes, many bacteria, trace hgb)  - Urine and Blood cultures pending  - Negative flu  # T2DM A1C 8.0. Currently on  Metformin at home  - Holding metformin  - Consider Lipid Panel no previously   # HLD  - Hold Atrovostatin  - Consider getting Lipid panel   # PAF: - Continue Eliquis and Metoprolol XR   # CAD: Drug eluting stent placed in 08/07/2015. Patient on Plavix - Continue Plavix   FEN/GI: Carb-Modified  Prophylaxis: On Eliquis   Subjective:  Patient states that chest pain is improved. She indicated having fever overnight, improved with tylenol. Indicates pain is worse with getting up to use the toilet and with cough. She was able to lay down a bit better. No n/v or SOB.   Objective: Temp:  [98.8 F (37.1 C)-101.1 F (38.4 C)] 99.7 F (37.6 C) (02/09 0538) Pulse Rate:  [49-98] 67 (02/09 0538) Resp:  [17-35] 18 (02/09 0538) BP: (110-149)/(51-87) 133/51 mmHg (02/09 0538) SpO2:  [92 %-98 %] 92 % (02/09 0538) Weight:  [149 lb 14.6 oz (68 kg)-154 lb (69.854 kg)] 149 lb 14.6 oz (68 kg) (02/09 0538) Physical Exam: General: Sitting up in bed, NAD  Cardiovascular: RRR, no murmurs, rubs or gallops  Respiratory: bibasilar crackles otherwise CTAB, slight inspiratory wheezing Abdomen: BS+, no ttp, no rebound  Ext: No lower extremity edema   Laboratory:  Recent Labs Lab 09/18/15 1125 09/19/15 0702  WBC 13.1* 14.6*  HGB 11.5* 11.3*  HCT 35.7* 34.0*  PLT 209 215    Recent Labs Lab 09/18/15 1125 09/19/15 0702  NA 136 135  K 4.4 4.1  CL 100* 100*  CO2 24 23  BUN 10 9  CREATININE 0.76 0.73  CALCIUM 9.3 8.8*  GLUCOSE 253* 217*  Recent Labs Lab 09/18/15 0850 09/19/15 0030 09/19/15 0702  TROPONINI 0.03 0.03 0.03    Imaging/Diagnostic Tests: Dg Chest 2 View  09/18/2015  EXAM: CHEST  2 VIEW COMPARISON:  None. FINDINGS: Normal cardiac silhouette. There is LEFT basilar atelectasis small effusion. RIGHT lung is clear. Upper lobes clear. IMPRESSION: LEFT basilar atelectasis and effusion. Recommend follow-up radiographs (4 to 6 weeks) to demonstrate resolution of  unilateral pleural effusion versus further evaluation with contrast CT. Electronically Signed   By: Suzy Bouchard M.D.   On: 09/18/2015 11:56   Ct Chest Wo Contrast  09/18/2015  ADDENDUM REPORT: 09/18/2015 16:44 ADDENDUM: These results were called by telephone on 09/18/2015 at 4:35 pm to Dr. Lanetta Inch, who verbally acknowledged these results. Electronically Signed   By: Franki Cabot M.D.   On: 09/18/2015 16:44  09/18/2015  CLINICAL DATA:  Right-sided chest pain and shortness breath for 1 week. EXAM: CT CHEST WITHOUT CONTRAST TECHNIQUE: Multidetector CT imaging of the chest was performed following the standard protocol without IV contrast. COMPARISON:  None. FINDINGS: Mediastinum/Lymph Nodes: There is a moderate-sized pericardial effusion, measuring 2 cm greatest thickness both anteriorly and posteriorly. Overall, there is cardiomegaly which is mild to moderate in degree. Appears to be a stent within the left anterior descending coronary artery. Suspect additional coronary artery calcifications within the more proximal LAD. Scattered mild atherosclerotic calcifications noted along the walls of the normal-caliber thoracic aorta. Scattered small lymph nodes within the mediastinum. No masses or enlarged lymph nodes within the mediastinum or perihilar regions. Lungs/Pleura: Moderate-sized left pleural effusion with adjacent compressive atelectasis. Smaller right pleural effusion with adjacent atelectasis. Upper lungs are clear. Trachea and central bronchi are unremarkable. Upper abdomen: Limited images of the upper abdomen are unremarkable. Musculoskeletal: No chest wall mass or suspicious bone lesions identified. Mild degenerative change noted within the thoracic spine. IMPRESSION: 1. Pericardial effusion, moderate in size, measuring 2 cm greatest thickness both anteriorly and posteriorly. 2. Cardiomegaly, mild to moderate in degree. Probable stent within the left anterior descending coronary artery. Additional  atherosclerotic calcifications involving the more proximal LAD. 3. Bilateral pleural effusions, left greater than right, with associated bibasilar atelectasis. Electronically Signed: By: Franki Cabot M.D. On: 09/18/2015 16:04    Jacquelynn Friend Cletis Media, MD 09/19/2015, 9:29 AM PGY-1, [place] Intern pager: [phone], text pages welcome

## 2015-09-19 NOTE — Progress Notes (Signed)
Pt temp 102.8(O), and HR 150's. Paged MD to ask for any new orders. Waiting on response. Given PRN APAP 650mg . Will continue to monitor.

## 2015-09-19 NOTE — Progress Notes (Signed)
Report given to nurse on [place]. Pt transferred without complications.

## 2015-09-19 NOTE — Progress Notes (Signed)
Pt arrived to unit at 1400 via wheelchair. Pt given CHG bath and MRSA PCR collected. Family at bedside. Will continue to monitor.

## 2015-09-19 NOTE — Progress Notes (Signed)
Patient Name: Brandi Frey Date of Encounter: 09/19/2015   SUBJECTIVE  Denies chest pain or sob at rest. However dyspnic with minimal activity.   CURRENT MEDS . aspirin  81 mg Oral Daily  . cefTRIAXone (ROCEPHIN)  IV  1 g Intravenous Q24H  . clopidogrel  75 mg Oral Q breakfast  . insulin aspart  0-9 Units Subcutaneous TID WC  . latanoprost  1 drop Both Eyes QHS  . metoprolol succinate  25 mg Oral q morning - 10a  . sodium chloride flush  3 mL Intravenous Q12H    OBJECTIVE  Filed Vitals:   09/18/15 1823 09/18/15 2139 09/19/15 0538 09/19/15 1115  BP: 125/66 135/61 133/51 121/54  Pulse: 49 96 67 91  Temp: 98.8 F (37.1 C) 101.1 F (38.4 C) 99.7 F (37.6 C) 99.7 F (37.6 C)  TempSrc: Oral Oral Oral Oral  Resp: 20 20 18    Height:      Weight:   149 lb 14.6 oz (68 kg)   SpO2: 98% 94% 92% 94%    Intake/Output Summary (Last 24 hours) at 09/19/15 1315 Last data filed at 09/19/15 0618  Gross per 24 hour  Intake    850 ml  Output   1700 ml  Net   -850 ml   Filed Weights   09/18/15 1206 09/19/15 0538  Weight: 154 lb (69.854 kg) 149 lb 14.6 oz (68 kg)    PHYSICAL EXAM  General: Pleasant, NAD. Neuro: Alert and oriented X 3. Moves all extremities spontaneously. Psych: Normal affect. HEENT:  Normal  Neck: Supple without bruits or JVD. Lungs:  Resp regular and unlabored, CTA. Heart: RRR no s3, s4, or murmurs. No rub.  Abdomen: Soft, non-tender, non-distended, BS + x 4.  Extremities: No clubbing, cyanosis or edema. DP/PT/Radials 2+ and equal bilaterally.  Accessory Clinical Findings  CBC  Recent Labs  09/18/15 1125 09/19/15 0702  WBC 13.1* 14.6*  HGB 11.5* 11.3*  HCT 35.7* 34.0*  MCV 84.6 84.4  PLT 209 [id number]   Basic Metabolic Panel  Recent Labs  09/18/15 1125 09/19/15 0702  NA 136 135  K 4.4 4.1  CL 100* 100*  CO2 24 23  GLUCOSE 253* 217*  BUN 10 9  CREATININE 0.76 0.73  CALCIUM 9.3 8.8*   Cardiac Enzymes  Recent Labs  09/18/15 0850  09/19/15 0030 09/19/15 0702  TROPONINI 0.03 0.03 0.03  Thyroid Function Tests  Recent Labs  09/18/15 1850  TSH 3.937    TELE  Sinus rhythm with PVCs and PAcs. Rate stable this morning  Radiology/Studies  Dg Chest 2 View  09/18/2015  EXAM: CHEST  2 VIEW COMPARISON:  None. FINDINGS: Normal cardiac silhouette. There is LEFT basilar atelectasis small effusion. RIGHT lung is clear. Upper lobes clear. IMPRESSION: LEFT basilar atelectasis and effusion. Recommend follow-up radiographs (4 to 6 weeks) to demonstrate resolution of unilateral pleural effusion versus further evaluation with contrast CT. Electronically Signed   By: Suzy Bouchard M.D.   On: 09/18/2015 11:56   Ct Chest Wo Contrast  09/18/2015  ADDENDUM REPORT: 09/18/2015 16:44 ADDENDUM: These results were called by telephone on 09/18/2015 at 4:35 pm to Dr. Lanetta Inch, who verbally acknowledged these results. Electronically Signed   By: Franki Cabot M.D.   On: 09/18/2015 16:44  09/18/2015  CLINICAL DATA:  Right-sided chest pain and shortness breath for 1 week. EXAM: CT CHEST WITHOUT CONTRAST TECHNIQUE: Multidetector CT imaging of the chest was performed following the standard protocol without IV contrast. COMPARISON:  None.  FINDINGS: Mediastinum/Lymph Nodes: There is a moderate-sized pericardial effusion, measuring 2 cm greatest thickness both anteriorly and posteriorly. Overall, there is cardiomegaly which is mild to moderate in degree. Appears to be a stent within the left anterior descending coronary artery. Suspect additional coronary artery calcifications within the more proximal LAD. Scattered mild atherosclerotic calcifications noted along the walls of the normal-caliber thoracic aorta. Scattered small lymph nodes within the mediastinum. No masses or enlarged lymph nodes within the mediastinum or perihilar regions. Lungs/Pleura: Moderate-sized left pleural effusion with adjacent compressive atelectasis. Smaller right pleural effusion with  adjacent atelectasis. Upper lungs are clear. Trachea and central bronchi are unremarkable. Upper abdomen: Limited images of the upper abdomen are unremarkable. Musculoskeletal: No chest wall mass or suspicious bone lesions identified. Mild degenerative change noted within the thoracic spine. IMPRESSION: 1. Pericardial effusion, moderate in size, measuring 2 cm greatest thickness both anteriorly and posteriorly. 2. Cardiomegaly, mild to moderate in degree. Probable stent within the left anterior descending coronary artery. Additional atherosclerotic calcifications involving the more proximal LAD. 3. Bilateral pleural effusions, left greater than right, with associated bibasilar atelectasis. Electronically Signed: By: Franki Cabot M.D. On: 09/18/2015 16:04    Echo 09/19/15 LV EF: 65% -  70%  ------------------------------------------------------------------- Indications:   Chest pain 786.51.  ------------------------------------------------------------------- History:  PMH: Pericardial and Pleural Effusion. Dyspnea. Coronary artery disease. Angina pectoris. PMH:  Myocardial infarction. Risk factors: Hypertension. Diabetes mellitus. Dyslipidemia.  ------------------------------------------------------------------- Study Conclusions  - Left ventricle: The cavity size was normal. Wall thickness was normal. Systolic function was vigorous. The estimated ejection fraction was in the range of 65% to 70%. Wall motion was normal; there were no regional wall motion abnormalities. - Mitral valve: Calcified annulus. - Left atrium: The atrium was mildly dilated. - Pulmonary arteries: Systolic pressure was mildly increased. PA peak pressure: 31 mm Hg (S). - Pericardium, extracardiac: A large pericardial effusion was identified circumferential to the heart, along the right ventricular free wall, and along the right atrial free wall. There was moderateright atrial chamber  collapse. There was moderate right ventricular chamber collapse noted in subcostal view. Features were consistent with tamponade physiology. There was a left pleural effusion. Maximum effusion diameter 2.3cm along RV and RA free wall.   ASSESSMENT AND PLAN Active Problems:   CAP (community acquired pneumonia)   Pain in the chest   Shortness of breath   Pericardial effusion   Pleural effusion   SIRS (systemic inflammatory response syndrome) (HCC)   Elevated lactic acid level   Type 2 diabetes mellitus with complication (HCC)   1. Pericardiac effusion  - Echo showed A large pericardial effusion was identified circumferential to the heart, along the right entricular free wall, and along the right atrial free wall. There was moderateright atrial chamber collapse. There was m oderate right ventricular chamber collapse noted in subcostal view. Features were consistent with tamponade physiology. There was a left pleural effusion. Maximum effusion diameter 2.3cm along RV and RA free wall. - vital sign stable. No SOB at rest. Transfer to step down. Vital sign stable. Plan per MD. Likely medical management.   2. CAD  - s/p DESx2 to LAD in 2009 and recent DES to LAD in 07/2015 - Cath 08/07/2015 mild in-stent restenosis of previous LAD stent, 99% mid LAD stenosis beyond that no branch treated with drug-eluting stent - Continue plavix and plavix  3. Paroxysmal atrial fibrillation  - CHA2DS2-Vasc score 5 (HTN, DM, age, CAD, female). Maintaining sinus rhythm. Eliquis was on hold to avoid  bleeding complications or hemorrhagic transformation of her pericardial effusion.  - Continue BB.   4. Sepsis - Per primary   Signed, Bhagat,Bhavinkumar PA-C Pager [phone]  Patient seen, examined. Available data reviewed. Agree with findings, assessment, and plan as outlined by Robbie Lis, PA-C. I have reviewed the patient's echocardiogram and echo signs of tamponade on are noted. Despite that, the  patient remains clinically quite stable. She is normotensive. She has had episodes of frequent PACs and brief runs of atrial fibrillation. This is likely related to pericardial inflammation. We discussed potential treatment options including pericardiocentesis versus medical therapy with nonsteroidal anti-inflammatories. I think the best course of action is to treat her with ibuprofen and follow her clinically over the next few days. Will reserve pericardiocentesis for hemodynamic compromise or clinical signs of tamponade. Plan discussed at length with the patient and her family. I would continue to hold Eliquis at this time.  Sherren Mocha, M.D. 09/19/2015 4:29 PM

## 2015-09-20 DIAGNOSIS — I319 Disease of pericardium, unspecified: Secondary | ICD-10-CM | POA: Insufficient documentation

## 2015-09-20 LAB — CBC
HCT: 32.1 % — ABNORMAL LOW (ref 36.0–46.0)
Hemoglobin: 10.7 g/dL — ABNORMAL LOW (ref 12.0–15.0)
MCH: 28.4 pg (ref 26.0–34.0)
MCHC: 33.3 g/dL (ref 30.0–36.0)
MCV: 85.1 fL (ref 78.0–100.0)
PLATELETS: 200 10*3/uL (ref 150–400)
RBC: 3.77 MIL/uL — ABNORMAL LOW (ref 3.87–5.11)
RDW: 13.8 % (ref 11.5–15.5)
WBC: 10.7 10*3/uL — AB (ref 4.0–10.5)

## 2015-09-20 LAB — URINE CULTURE

## 2015-09-20 LAB — LIPID PANEL
Cholesterol: 108 mg/dL (ref 0–200)
HDL: 40 mg/dL — AB (ref >40)
LDL Cholesterol: 57 mg/dL (ref 0–99)
TRIGLYCERIDES: 57 mg/dL (ref <150)
Total CHOL/HDL Ratio: 2.7 RATIO
VLDL: 11 mg/dL (ref 0–40)

## 2015-09-20 LAB — GLUCOSE, CAPILLARY
GLUCOSE-CAPILLARY: 119 mg/dL — AB (ref 65–99)
GLUCOSE-CAPILLARY: 286 mg/dL — AB (ref 65–99)
Glucose-Capillary: 190 mg/dL — ABNORMAL HIGH (ref 65–99)
Glucose-Capillary: 200 mg/dL — ABNORMAL HIGH (ref 65–99)

## 2015-09-20 LAB — RHEUMATOID FACTOR: RHEUMATOID FACTOR: 19.1 [IU]/mL — AB (ref 0.0–13.9)

## 2015-09-20 MED ORDER — COLCHICINE 0.6 MG PO TABS
0.6000 mg | ORAL_TABLET | Freq: Every day | ORAL | Status: DC
  Administered 2015-09-20 – 2015-09-21 (×2): 0.6 mg via ORAL
  Filled 2015-09-20 (×5): qty 1

## 2015-09-20 MED ORDER — METOPROLOL TARTRATE 12.5 MG HALF TABLET
12.5000 mg | ORAL_TABLET | Freq: Two times a day (BID) | ORAL | Status: DC
  Administered 2015-09-20 – 2015-09-22 (×5): 12.5 mg via ORAL
  Filled 2015-09-20 (×5): qty 1

## 2015-09-20 MED ORDER — METOPROLOL TARTRATE 1 MG/ML IV SOLN
5.0000 mg | Freq: Once | INTRAVENOUS | Status: AC
  Administered 2015-09-20: 5 mg via INTRAVENOUS
  Filled 2015-09-20: qty 5

## 2015-09-20 MED ORDER — PANTOPRAZOLE SODIUM 40 MG PO TBEC
40.0000 mg | DELAYED_RELEASE_TABLET | Freq: Every day | ORAL | Status: DC
  Administered 2015-09-20 – 2015-09-22 (×3): 40 mg via ORAL
  Filled 2015-09-20 (×3): qty 1

## 2015-09-20 MED ORDER — POLYETHYLENE GLYCOL 3350 17 G PO PACK
17.0000 g | PACK | Freq: Every day | ORAL | Status: DC
  Administered 2015-09-20: 17 g via ORAL
  Filled 2015-09-20 (×3): qty 1

## 2015-09-20 MED ORDER — DILTIAZEM HCL 100 MG IV SOLR
5.0000 mg/h | INTRAVENOUS | Status: DC
  Administered 2015-09-20: 5 mg/h via INTRAVENOUS
  Filled 2015-09-20: qty 100

## 2015-09-20 MED ORDER — DILTIAZEM LOAD VIA INFUSION
15.0000 mg | Freq: Once | INTRAVENOUS | Status: AC
  Administered 2015-09-20: 15 mg via INTRAVENOUS
  Filled 2015-09-20: qty 15

## 2015-09-20 MED ORDER — METOPROLOL TARTRATE 12.5 MG HALF TABLET: 12.5000 mg | ORAL_TABLET | Freq: Two times a day (BID) | ORAL | Status: DC

## 2015-09-20 NOTE — Progress Notes (Signed)
FPTS Interim Progress Note  S:Patient doing well this afternoon. No SOB, dizziness. Chest pain minimal. Enjoying her lunch.  O: BP 113/61 mmHg  Pulse 69  Temp(Src) 98.1 F (36.7 C) (Oral)  Resp 21  Ht 5\' 5"  (1.651 m)  Wt 150 lb 3.2 oz (68.13 kg)  BMI 24.99 kg/m2  SpO2 95%   A/P: -Vitals stable  - Will continue to follow closely   Brandi Frey Cletis Media, MD 09/20/2015, 2:05 PM PGY-1, [place] Medicine Service pager [phone]

## 2015-09-20 NOTE — Progress Notes (Addendum)
Pt suddenly had a heart rate of 150-200 around 2050.  EKG was done which showed AFIB RVR.  FMTS was paged, order for 5mg  metoprolol IV was placed and metoprolol given at 2120.  When metoprolol was given heart rate was 220, a few mins after it is between 140-150.  While heart rate was elevated in the 200's pt stated she was feeling right/left chest pain that radiated down the right shoulder. Now that heart rate is down to 140's the pain in the right shoulder is gone but still hurts straight across the chest. RN will continue to monitor.

## 2015-09-20 NOTE — Progress Notes (Addendum)
Cardiology was paged after Afib RVR lasted for one hour and 5mg  IV metoprolol did not help.  Cardizem was ordered. Pt is now on Cardizem gtt at 5mg /hr.  Heart rate initially went into Afib RVR at 2052 and converted to NSR at 2247.  Heart rate is now NSR in the 70's and blood pressure 100/57. RN will continue to monitor.

## 2015-09-20 NOTE — Progress Notes (Signed)
Family Medicine Teaching Service Daily Progress Note Intern Pager: [phone]  Patient name: Brandi Frey Medical record number: [id number] Date of birth: 01-18-1941 Age: 75 y.o. Gender: female  Primary Care Provider: [name], MD Consultants: Cardiology  Code Status: DNR  Pt Overview and Major Events to Date:   Assessment and Plan: Brandi Frey is a 75 y.o. female presenting with chest pain . PMH is significant for HTN, HLD, PAF, T2DM, CAD, SVT.   # Chest Pain likely due to Peracidities/Dressler:  Chest pain improved from yesterday. WBC trending down. Recent hx of stent placement on December 28 due NSTEMI. Troponin negative 3.CT positive for pericardial effusion 2 cm in size bilateral pleural effusions and bibasilar atelectasis. ECHO normal EF on 07/2015. CTA negative for PE, no consolidation or infiltrate noted therefore less likely CAP. Due to recent hx of cardiac injury, possible post-cardiac inflammatory response due to current leukocytosis, febrile, pleuritic chest pain, pericardial effusion, and pleural effusion would consider Dressler syndrome. Ruled out ACS with troponins x 3 and EKG  - ECHO, EF 65- 75%, large percardial effusion (2.3 cm), and moderate right ventricular chamber collapse noted in subcostal view. Features were consistent with tamponade physiology. - Awaiting repeat EKG  - Follow up cardiology recs, holding Eliquis per cardiology, started aspirin 81 mg, Ibuprofen 650 mg TID  - Vitals per floor, telemetry and pulse oximetry  - Autoimmune process with RH 19.1 and CRP 25.9 elevated; ANA pending    #SIRS Criteria: Yesterday febrile to 102.4, overnight BP 97/55, however improved blood pressure this AM. WBC 10.7.  Concern for CAP, therefore ED started patient on azithromycin and ceftriaxone. However, no infiltrate or consolidation noted on CT or CXR making CAP unlikely  - WBC 13> 14.6> 10.7  - Urine and Blood cultures NGTD - Negative flu - Consider  stopping ceftriaxone    # T2DM A1C 8.0. Currently on Metformin at home.  - Holding metformin  - Consider Lipid Panel no previously   # HLD  - Hold Atrovostatin  - Consider getting Lipid panel   # PAF: Runs atrial fibrillation and PAC yesterday of telemetry  - Continue aspirin and Metoprolol 12.5 mg BID   # CAD: Drug eluting stent placed in 08/07/2015. Patient on Plavix - Continue Plavix   FEN/GI: Carb-Modified  Prophylaxis: On Aspirin and SCDs  Dispo:   Subjective:  Patient doing well this AM. Decreased chest pain per patient. No SOB, no dizziness, no n/v, or fever/ chills   Objective: Temp:  [97.6 F (36.4 C)-102.4 F (39.1 C)] 98.1 F (36.7 C) (02/10 0320) Pulse Rate:  [60-126] 60 (02/10 0320) Resp:  [16-32] 16 (02/10 0320) BP: (94-150)/(51-88) 97/55 mmHg (02/10 0400) SpO2:  [94 %-98 %] 98 % (02/10 0320) Weight:  [150 lb 3.2 oz (68.13 kg)] 150 lb 3.2 oz (68.13 kg) (02/10 0600) Physical Exam: General: Sitting up in bed, NAD  Cardiovascular: RRR, no murmurs, rubs or gallops  Respiratory: CTAB, no wheezes, rhonchi  Abdomen: BS+, no ttp, no rebound  Ext: No lower extremity edema   Laboratory:  Recent Labs Lab 09/18/15 1125 09/19/15 0702  WBC 13.1* 14.6*  HGB 11.5* 11.3*  HCT 35.7* 34.0*  PLT 209 215    Recent Labs Lab 09/18/15 1125 09/19/15 0702  NA 136 135  K 4.4 4.1  CL 100* 100*  CO2 24 23  BUN 10 9  CREATININE 0.76 0.73  CALCIUM 9.3 8.8*  GLUCOSE 253* 217*      Recent Labs Lab 09/18/15 0850 09/19/15 0030 09/19/15  [id number]  TROPONINI 0.03 0.03 0.03    Imaging/Diagnostic Tests: Dg Chest 2 View  09/18/2015  EXAM: CHEST  2 VIEW COMPARISON:  None. FINDINGS: Normal cardiac silhouette. There is LEFT basilar atelectasis small effusion. RIGHT lung is clear. Upper lobes clear. IMPRESSION: LEFT basilar atelectasis and effusion. Recommend follow-up radiographs (4 to 6 weeks) to demonstrate resolution of unilateral pleural effusion versus  further evaluation with contrast CT. Electronically Signed   By: Suzy Bouchard M.D.   On: 09/18/2015 11:56   Ct Chest Wo Contrast  09/18/2015  ADDENDUM REPORT: 09/18/2015 16:44 ADDENDUM: These results were called by telephone on 09/18/2015 at 4:35 pm to Dr. Lanetta Inch, who verbally acknowledged these results. Electronically Signed   By: Franki Cabot M.D.   On: 09/18/2015 16:44  09/18/2015  CLINICAL DATA:  Right-sided chest pain and shortness breath for 1 week. EXAM: CT CHEST WITHOUT CONTRAST TECHNIQUE: Multidetector CT imaging of the chest was performed following the standard protocol without IV contrast. COMPARISON:  None. FINDINGS: Mediastinum/Lymph Nodes: There is a moderate-sized pericardial effusion, measuring 2 cm greatest thickness both anteriorly and posteriorly. Overall, there is cardiomegaly which is mild to moderate in degree. Appears to be a stent within the left anterior descending coronary artery. Suspect additional coronary artery calcifications within the more proximal LAD. Scattered mild atherosclerotic calcifications noted along the walls of the normal-caliber thoracic aorta. Scattered small lymph nodes within the mediastinum. No masses or enlarged lymph nodes within the mediastinum or perihilar regions. Lungs/Pleura: Moderate-sized left pleural effusion with adjacent compressive atelectasis. Smaller right pleural effusion with adjacent atelectasis. Upper lungs are clear. Trachea and central bronchi are unremarkable. Upper abdomen: Limited images of the upper abdomen are unremarkable. Musculoskeletal: No chest wall mass or suspicious bone lesions identified. Mild degenerative change noted within the thoracic spine. IMPRESSION: 1. Pericardial effusion, moderate in size, measuring 2 cm greatest thickness both anteriorly and posteriorly. 2. Cardiomegaly, mild to moderate in degree. Probable stent within the left anterior descending coronary artery. Additional atherosclerotic calcifications involving  the more proximal LAD. 3. Bilateral pleural effusions, left greater than right, with associated bibasilar atelectasis. Electronically Signed: By: Franki Cabot M.D. On: 09/18/2015 16:04    Syann Cupples Cletis Media, MD 09/20/2015, 7:19 AM PGY-1, [place] Intern pager: [phone], text pages welcome

## 2015-09-20 NOTE — Care Management Important Message (Signed)
Important Message  Patient Details  Name: Brandi Frey MRN: [id number] Date of Birth: [birth year]   Medicare Important Message Given:  Yes    [name] Abena 09/20/2015, 11:14 AM

## 2015-09-20 NOTE — Progress Notes (Signed)
    Subjective:  Feels better today. Pleuritic CP improved. No dyspnea at rest.   Objective:  Vital Signs in the last 24 hours: Temp:  [97.5 F (36.4 C)-102.4 F (39.1 C)] 98.1 F (36.7 C) (02/10 1222) Pulse Rate:  [60-126] 69 (02/10 1222) Resp:  [16-28] 21 (02/10 1222) BP: (94-150)/(51-88) 113/61 mmHg (02/10 1222) SpO2:  [95 %-98 %] 95 % (02/10 1222) Weight:  [68.13 kg (150 lb 3.2 oz)] 68.13 kg (150 lb 3.2 oz) (02/10 0600)  Intake/Output from previous day: 02/09 0701 - 02/10 0700 In: 290 [P.O.:240; IV Piggyback:50] Out: -   Physical Exam: Pt is alert and oriented, NAD HEENT: normal Neck: JVP - normal Lungs: CTA bilaterally CV: RRR without murmur or gallop, no rub Abd: soft, NT, Positive BS, no hepatomegaly Ext: no C/C/E, distal pulses intact and equal Skin: warm/dry no rash   Lab Results:  Recent Labs  09/19/15 0702 09/20/15 0855  WBC 14.6* 10.7*  HGB 11.3* 10.7*  PLT 215 200    Recent Labs  09/18/15 1125 09/19/15 0702  NA 136 135  K 4.4 4.1  CL 100* 100*  CO2 24 23  GLUCOSE 253* 217*  BUN 10 9  CREATININE 0.76 0.73    Recent Labs  09/19/15 0030 09/19/15 0702  TROPONINI 0.03 0.03    Cardiac Studies: 2D Echo 2/9: Study Conclusions  - Left ventricle: The cavity size was normal. Wall thickness was normal. Systolic function was vigorous. The estimated ejection fraction was in the range of 65% to 70%. Wall motion was normal; there were no regional wall motion abnormalities. - Mitral valve: Calcified annulus. - Left atrium: The atrium was mildly dilated. - Pulmonary arteries: Systolic pressure was mildly increased. PA peak pressure: 31 mm Hg (S). - Pericardium, extracardiac: A large pericardial effusion was identified circumferential to the heart, along the right ventricular free wall, and along the right atrial free wall. There was moderateright atrial chamber collapse. There was moderate right ventricular chamber collapse  noted in subcostal view. Features were consistent with tamponade physiology. There was a left pleural effusion. Maximum effusion diameter 2.3cm along RV and RA free wall.  Tele: Sinus rhythm  Assessment/Plan:  1. 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 Clinically improved on ibuprofen and colchicine. No clinical signs of tamponade. Will check limited 2D echo tomorrow to make sure effusion is not worsening - clinically better.  2. CAD: no angina. Stable on plavix. ASA started at low dose  3. PAF: Eliquis stopped. Would keep off Eliquis over next few weeks while on ASA, plavix, and high-dose ibuprofen.   Dispo: as long as echo is ok tomorrow, would be ok to discharge from cardiac perspective on:  Ibuprofen 800 mg TID x 2 weeks  Protonix 40 mg daily while on ibuprofen  Colchicine 0.6 mg x 2 months  FU limited echo in 2 weeks  Sherren Mocha, M.D. 09/20/2015, 1:02 PM

## 2015-09-21 ENCOUNTER — Inpatient Hospital Stay (HOSPITAL_COMMUNITY): Payer: Medicare Other

## 2015-09-21 DIAGNOSIS — I319 Disease of pericardium, unspecified: Secondary | ICD-10-CM

## 2015-09-21 LAB — GLUCOSE, CAPILLARY
GLUCOSE-CAPILLARY: 217 mg/dL — AB (ref 65–99)
GLUCOSE-CAPILLARY: 238 mg/dL — AB (ref 65–99)
Glucose-Capillary: 179 mg/dL — ABNORMAL HIGH (ref 65–99)
Glucose-Capillary: 275 mg/dL — ABNORMAL HIGH (ref 65–99)

## 2015-09-21 MED ORDER — DILTIAZEM HCL 60 MG PO TABS
60.0000 mg | ORAL_TABLET | Freq: Three times a day (TID) | ORAL | Status: DC
  Administered 2015-09-21 – 2015-09-22 (×5): 60 mg via ORAL
  Filled 2015-09-21 (×5): qty 1

## 2015-09-21 NOTE — Progress Notes (Signed)
    Subjective:  Feels better today. Pleuritic CP improved. No dyspnea  Objective:  Vital Signs in the last 24 hours: Temp:  [98 F (36.7 C)-99.6 F (37.6 C)] 99.6 F (37.6 C) (02/11 1245) Pulse Rate:  [41-226] 77 (02/11 1245) Resp:  [18-34] 24 (02/11 1245) BP: (91-130)/(52-84) 121/63 mmHg (02/11 1314) SpO2:  [92 %-100 %] 95 % (02/11 1245) Weight:  [151 lb 0.2 oz (68.5 kg)] 151 lb 0.2 oz (68.5 kg) (02/11 0500)  Intake/Output from previous day: 02/10 0701 - 02/11 0700 In: 1220 [P.O.:1200; I.V.:20] Out: -   Physical Exam: Pt is alert and oriented, NAD HEENT: normal Neck: JVP - normal Lungs: Diminished BS bases CV: RRR  Abd: soft, NT, Positive BS, no masses Ext: no edema Skin: warm/dry no rash   Lab Results:  Recent Labs  09/19/15 0702 09/20/15 0855  WBC 14.6* 10.7*  HGB 11.3* 10.7*  PLT 215 200    Recent Labs  09/19/15 0702  NA 135  K 4.1  CL 100*  CO2 23  GLUCOSE 217*  BUN 9  CREATININE 0.73    Recent Labs  09/19/15 0030 09/19/15 0702  TROPONINI 0.03 0.03    Cardiac Studies: 2D Echo 2/9: Study Conclusions  - Left ventricle: The cavity size was normal. Wall thickness was normal. Systolic function was vigorous. The estimated ejection fraction was in the range of 65% to 70%. Wall motion was normal; there were no regional wall motion abnormalities. - Mitral valve: Calcified annulus. - Left atrium: The atrium was mildly dilated. - Pulmonary arteries: Systolic pressure was mildly increased. PA peak pressure: 31 mm Hg (S). - Pericardium, extracardiac: A large pericardial effusion was identified circumferential to the heart, along the right ventricular free wall, and along the right atrial free wall. There was moderateright atrial chamber collapse. There was moderate right ventricular chamber collapse noted in subcostal view. Features were consistent with tamponade physiology. There was a left pleural effusion. Maximum  effusion diameter 2.3cm along RV and RA free wall.  Tele: Sinus rhythm  Assessment/Plan:  1. 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 Clinically improved on ibuprofen and colchicine. No clinical signs of tamponade. Await fu limited echo this AM to reassess.  2. CAD: no angina. Stable on plavix. ASA started at low dose  3. PAF: Eliquis stopped due to risk of hemorrhagic conversion of effusion. Would keep off Eliquis over next few weeks while on ASA, plavix, and high-dose ibuprofen. Patient and family understand higher risk of CVA at this point but feel risk for now out weighs benefit with pericardial effusion. Patient had PAF last PM with HR 200. cardizem added; continue metoprolol and monitor tonight.   Dispo: as long as echo is ok and rhythm stable overnight, would be ok to discharge from cardiac perspective in AM on:  Ibuprofen 800 mg TID x 2 weeks  Protonix 40 mg daily while on ibuprofen  Colchicine 0.6 mg x 2 months  FU limited echo in 2 weeks FU Dr Radford Pax one week following DC Kirk Ruths, M.D. 09/21/2015, 1:52 PM

## 2015-09-21 NOTE — Progress Notes (Signed)
Family Medicine Teaching Service Daily Progress Note Intern Pager: [phone]  Patient name: Brandi Frey Medical record number: [id number] Date of birth: [birth year] Age: 75 y.o. Gender: female  Primary Care Provider: [name], MD Consultants: Cardiology  Code Status: DNR  Pt Overview and Major Events to Date:   Assessment and Plan: 75 y.o. female presenting with chest pain. PMH is significant for HTN, HLD, PAF, T2DM, CAD, SVT.   # Chest Pain likely due to Peraciditis/Dressler:  Recent hx of stent placement on December 28 due NSTEMI. Troponin negative 3.CT positive for pericardial effusion 2 cm in size bilateral pleural effusions and bibasilar atelectasis. CTA negative for PE, no consolidation or infiltrate noted. ECHO, EF 65- 75%, large percardial effusion (2.3 cm), and moderate right ventricular chamber collapse noted in subcostal view; features were consistent with tamponade physiology. - Vitals per floor, telemetry and pulse oximetry  - Autoimmune process with RH 19.1 and CRP 25.9 elevated - Check ANA  - Cardiology consulted, appreciate recommendations.  - Holding Eliquis  - Aspirin 81 mg, Ibuprofen 650 mg TID, Colchicine 0.6mg   - Repeat Echocardiogram 2/11 and again in two weeks  # Atrial fibrillation with RVR: HR up to 226 overnight. IV Metoprolol given with little effect and initiated on Cardizem drip per Cards. HR controlled this am.  - Monitor HR.  - Cardiology consulted per above. Continue Cardizem drip. - Continue aspirin and Metoprolol 12.5 mg BID   # T2DM A1C 8.0. Currently on Metformin at home.  - Holding metformin  - Sensitive Insulin Sliding Scale  FEN/GI: Carb-Modified  Prophylaxis: On Aspirin and SCDs  Dispo: Home pending clinical improvement  Subjective:  Feeling well this morning. Still notes mild pain with deep breaths, but much improved. Constipation resolved. Denies shortness of breath. No further concerns today.   Objective: Temp:   [97.5 F (36.4 C)-98.8 F (37.1 C)] 98.5 F (36.9 C) (02/11 0400) Pulse Rate:  [41-226] 62 (02/11 0500) Resp:  [19-34] 21 (02/11 0500) BP: (91-130)/(52-84) 106/56 mmHg (02/11 0500) SpO2:  [92 %-100 %] 95 % (02/11 0500) Weight:  [151 lb 0.2 oz (68.5 kg)] 151 lb 0.2 oz (68.5 kg) (02/11 0500) Physical Exam: General: 75yo female resting comfortably in no apparent distress Cardiovascular: RRR, no murmurs, rubs or gallops  Respiratory: CTAB, no wheezes, no increased work of breathing Abdomen: BS+, no ttp, soft and nondistended  Ext: No lower extremity edema   Laboratory:  Recent Labs Lab 09/18/15 1125 09/19/15 0702 09/20/15 0855  WBC 13.1* 14.6* 10.7*  HGB 11.5* 11.3* 10.7*  HCT 35.7* 34.0* 32.1*  PLT 209 215 200    Recent Labs Lab 09/18/15 1125 09/19/15 0702  NA 136 135  K 4.4 4.1  CL 100* 100*  CO2 24 23  BUN 10 9  CREATININE 0.76 0.73  CALCIUM 9.3 8.8*  GLUCOSE 253* 217*      Recent Labs Lab 09/18/15 0850 09/19/15 0030 09/19/15 0702  TROPONINI 0.03 0.03 0.03    Imaging/Diagnostic Tests: Dg Chest 2 View  09/18/2015  EXAM: CHEST  2 VIEW COMPARISON:  None. FINDINGS: Normal cardiac silhouette. There is LEFT basilar atelectasis small effusion. RIGHT lung is clear. Upper lobes clear. IMPRESSION: LEFT basilar atelectasis and effusion. Recommend follow-up radiographs (4 to 6 weeks) to demonstrate resolution of unilateral pleural effusion versus further evaluation with contrast CT. Electronically Signed   By: Suzy Bouchard M.D.   On: 09/18/2015 11:56   Ct Chest Wo Contrast  09/18/2015  ADDENDUM REPORT: 09/18/2015 16:44 ADDENDUM: These results were called  by telephone on 09/18/2015 at 4:35 pm to Dr. Lanetta Inch, who verbally acknowledged these results. Electronically Signed   By: Franki Cabot M.D.   On: 09/18/2015 16:44  09/18/2015  CLINICAL DATA:  Right-sided chest pain and shortness breath for 1 week. EXAM: CT CHEST WITHOUT CONTRAST TECHNIQUE: Multidetector CT imaging of  the chest was performed following the standard protocol without IV contrast. COMPARISON:  None. FINDINGS: Mediastinum/Lymph Nodes: There is a moderate-sized pericardial effusion, measuring 2 cm greatest thickness both anteriorly and posteriorly. Overall, there is cardiomegaly which is mild to moderate in degree. Appears to be a stent within the left anterior descending coronary artery. Suspect additional coronary artery calcifications within the more proximal LAD. Scattered mild atherosclerotic calcifications noted along the walls of the normal-caliber thoracic aorta. Scattered small lymph nodes within the mediastinum. No masses or enlarged lymph nodes within the mediastinum or perihilar regions. Lungs/Pleura: Moderate-sized left pleural effusion with adjacent compressive atelectasis. Smaller right pleural effusion with adjacent atelectasis. Upper lungs are clear. Trachea and central bronchi are unremarkable. Upper abdomen: Limited images of the upper abdomen are unremarkable. Musculoskeletal: No chest wall mass or suspicious bone lesions identified. Mild degenerative change noted within the thoracic spine. IMPRESSION: 1. Pericardial effusion, moderate in size, measuring 2 cm greatest thickness both anteriorly and posteriorly. 2. Cardiomegaly, mild to moderate in degree. Probable stent within the left anterior descending coronary artery. Additional atherosclerotic calcifications involving the more proximal LAD. 3. Bilateral pleural effusions, left greater than right, with associated bibasilar atelectasis. Electronically Signed: By: Franki Cabot M.D. On: 09/18/2015 16:04    Lorna Few, DO 09/21/2015, 7:55 AM PGY-2, [place] Intern pager: [phone], text pages welcome

## 2015-09-21 NOTE — Progress Notes (Signed)
  Echocardiogram 2D Echocardiogram has been performed.  Brandi Frey 09/21/2015, 4:47 PM

## 2015-09-22 ENCOUNTER — Telehealth: Payer: Self-pay | Admitting: Family Medicine

## 2015-09-22 DIAGNOSIS — I480 Paroxysmal atrial fibrillation: Secondary | ICD-10-CM

## 2015-09-22 DIAGNOSIS — J90 Pleural effusion, not elsewhere classified: Secondary | ICD-10-CM

## 2015-09-22 LAB — GLUCOSE, CAPILLARY
GLUCOSE-CAPILLARY: 244 mg/dL — AB (ref 65–99)
Glucose-Capillary: 173 mg/dL — ABNORMAL HIGH (ref 65–99)

## 2015-09-22 LAB — CBC
HEMATOCRIT: 33.2 % — AB (ref 36.0–46.0)
Hemoglobin: 10.8 g/dL — ABNORMAL LOW (ref 12.0–15.0)
MCH: 27.3 pg (ref 26.0–34.0)
MCHC: 32.5 g/dL (ref 30.0–36.0)
MCV: 83.8 fL (ref 78.0–100.0)
PLATELETS: 288 10*3/uL (ref 150–400)
RBC: 3.96 MIL/uL (ref 3.87–5.11)
RDW: 13.4 % (ref 11.5–15.5)
WBC: 8.3 10*3/uL (ref 4.0–10.5)

## 2015-09-22 LAB — BASIC METABOLIC PANEL
ANION GAP: 12 (ref 5–15)
BUN: 18 mg/dL (ref 6–20)
CALCIUM: 8.9 mg/dL (ref 8.9–10.3)
CO2: 22 mmol/L (ref 22–32)
Chloride: 107 mmol/L (ref 101–111)
Creatinine, Ser: 0.8 mg/dL (ref 0.44–1.00)
Glucose, Bld: 186 mg/dL — ABNORMAL HIGH (ref 65–99)
POTASSIUM: 4.5 mmol/L (ref 3.5–5.1)
Sodium: 141 mmol/L (ref 135–145)

## 2015-09-22 MED ORDER — ACETAMINOPHEN 500 MG PO TABS: 1000.0000 mg | ORAL_TABLET | Freq: Three times a day (TID) | ORAL | Status: DC | PRN

## 2015-09-22 MED ORDER — DILTIAZEM HCL 60 MG PO TABS: 60.0000 mg | ORAL_TABLET | Freq: Three times a day (TID) | ORAL | Status: DC

## 2015-09-22 MED ORDER — PANTOPRAZOLE SODIUM 40 MG PO TBEC: 40.0000 mg | DELAYED_RELEASE_TABLET | Freq: Every day | ORAL | Status: DC

## 2015-09-22 MED ORDER — IBUPROFEN 800 MG PO TABS: 800.0000 mg | ORAL_TABLET | Freq: Three times a day (TID) | ORAL | Status: DC

## 2015-09-22 MED ORDER — COLCHICINE 0.6 MG PO TABS: 0.6000 mg | ORAL_TABLET | Freq: Every day | ORAL | Status: DC

## 2015-09-22 NOTE — Telephone Encounter (Signed)
Received page from nurse regarding patient's medication. Outpatient pharmacy closed today. Requested diltiazem be sent to [facility] on [address] in [place], [place]. Called pharmacy for prescription. Called patient's family to confirm prescription sent to pharmacy.  Meds ordered this encounter  Medications  . diltiazem (CARDIZEM) 60 MG tablet    Sig: Take 1 tablet (60 mg total) by mouth every 8 (eight) hours.    Dispense:  90 tablet    Refill:  0   Cordelia Poche, MD PGY-3, [facility] 09/22/2015, 3:39 PM

## 2015-09-22 NOTE — Progress Notes (Signed)
Pt d/c home in care of daughter. No complaints at time of d/c

## 2015-09-22 NOTE — Progress Notes (Signed)
Pt did very well overnight with no complications.

## 2015-09-22 NOTE — Progress Notes (Signed)
Family Medicine Teaching Service Daily Progress Note Intern Pager: [phone]  Patient name: Brandi Frey Medical record number: [id number] Date of birth: 1940/12/27 Age: 75 y.o. Gender: female  Primary Care Provider: [name], MD Consultants: Cardiology  Code Status: DNR  Pt Overview and Major Events to Date:   Assessment and Plan: 75 y.o. female presenting with chest pain. PMH is significant for HTN, HLD, PAF, T2DM, CAD, SVT.   # Chest Pain likely due to Peraciditis/Dressler:  Improving on high dose ibuprofen and colchicine. Repeat EF shows improving effusion. Vitals stable. No chest pain or dyspnea. Possibly autoimmune mediated with high RF and CRP. ANA pending. - Vitals per floor, telemetry and pulse oximetry  - follow-up ANA  - Cardiology consulted, appreciate recommendations.  - Holding Eliquis  - Aspirin 81 mg, Ibuprofen 800 mg TID, Colchicine 0.6mg   # Atrial fibrillation with RVR: HR up to 226 two nights ago. Cardizem drip started initially and converted to PO. Her heart rate has been stable  - Monitor HR.  - continue Cardizem 60mg  TID - Continue aspirin and Metoprolol 12.5 mg BID   # T2DM A1C 8.0. Currently on Metformin at home.  - Holding metformin  - Sensitive Insulin Sliding Scale  FEN/GI: Carb-Modified  Prophylaxis: On Aspirin and SCDs  Dispo: Home likely today with close follow-up with Cardiology outpatient.  Subjective:  Complains of an episode of palpitations this morning around 8am (afib on tele with controlled rate). She is otherwise having no chest pain or dyspnea.   Objective: Temp:  [98.1 F (36.7 C)-99.6 F (37.6 C)] 98.4 F (36.9 C) (02/12 0728) Pulse Rate:  [66-78] 70 (02/12 0728) Resp:  [17-29] 21 (02/12 0728) BP: (121-142)/(57-83) 132/57 mmHg (02/12 0728) SpO2:  [95 %-99 %] 95 % (02/12 0728) Weight:  [150 lb 5.7 oz (68.2 kg)] 150 lb 5.7 oz (68.2 kg) (02/12 0500)   Physical Exam: General: Well appearing, sitting up eating  breakfast. no apparent distress Cardiovascular: RRR, no murmurs, rubs or gallops  Respiratory: CTAB, no wheezes, no increased work of breathing. Slightly diminished at bases Abdomen: BS+, no ttp, soft and nondistended  Ext: No lower extremity edema   Laboratory:  Recent Labs Lab 09/19/15 0702 09/20/15 0855 09/22/15 0711  WBC 14.6* 10.7* 8.3  HGB 11.3* 10.7* 10.8*  HCT 34.0* 32.1* 33.2*  PLT 215 200 288    Recent Labs Lab 09/18/15 1125 09/19/15 0702  NA 136 135  K 4.4 4.1  CL 100* 100*  CO2 24 23  BUN 10 9  CREATININE 0.76 0.73  CALCIUM 9.3 8.8*  GLUCOSE 253* 217*      Recent Labs Lab 09/18/15 0850 09/19/15 0030 09/19/15 0702  TROPONINI 0.03 0.03 0.03    Imaging/Diagnostic Tests: Dg Chest 2 View  09/18/2015  EXAM: CHEST  2 VIEW COMPARISON:  None. FINDINGS: Normal cardiac silhouette. There is LEFT basilar atelectasis small effusion. RIGHT lung is clear. Upper lobes clear. IMPRESSION: LEFT basilar atelectasis and effusion. Recommend follow-up radiographs (4 to 6 weeks) to demonstrate resolution of unilateral pleural effusion versus further evaluation with contrast CT. Electronically Signed   By: Suzy Bouchard M.D.   On: 09/18/2015 11:56   Ct Chest Wo Contrast  09/18/2015  ADDENDUM REPORT: 09/18/2015 16:44 ADDENDUM: These results were called by telephone on 09/18/2015 at 4:35 pm to Dr. Lanetta Inch, who verbally acknowledged these results. Electronically Signed   By: Franki Cabot M.D.   On: 09/18/2015 16:44  09/18/2015  CLINICAL DATA:  Right-sided chest pain and shortness breath for 1  week. EXAM: CT CHEST WITHOUT CONTRAST TECHNIQUE: Multidetector CT imaging of the chest was performed following the standard protocol without IV contrast. COMPARISON:  None. FINDINGS: Mediastinum/Lymph Nodes: There is a moderate-sized pericardial effusion, measuring 2 cm greatest thickness both anteriorly and posteriorly. Overall, there is cardiomegaly which is mild to moderate in degree. Appears  to be a stent within the left anterior descending coronary artery. Suspect additional coronary artery calcifications within the more proximal LAD. Scattered mild atherosclerotic calcifications noted along the walls of the normal-caliber thoracic aorta. Scattered small lymph nodes within the mediastinum. No masses or enlarged lymph nodes within the mediastinum or perihilar regions. Lungs/Pleura: Moderate-sized left pleural effusion with adjacent compressive atelectasis. Smaller right pleural effusion with adjacent atelectasis. Upper lungs are clear. Trachea and central bronchi are unremarkable. Upper abdomen: Limited images of the upper abdomen are unremarkable. Musculoskeletal: No chest wall mass or suspicious bone lesions identified. Mild degenerative change noted within the thoracic spine. IMPRESSION: 1. Pericardial effusion, moderate in size, measuring 2 cm greatest thickness both anteriorly and posteriorly. 2. Cardiomegaly, mild to moderate in degree. Probable stent within the left anterior descending coronary artery. Additional atherosclerotic calcifications involving the more proximal LAD. 3. Bilateral pleural effusions, left greater than right, with associated bibasilar atelectasis. Electronically Signed: By: Franki Cabot M.D. On: 09/18/2015 16:04    Mariel Aloe, MD 09/22/2015, 8:11 AM PGY-3, [place] Intern pager: [phone], text pages welcome

## 2015-09-22 NOTE — Progress Notes (Signed)
Subjective:  Feels better today. Denies CP or SOB.  Objective:  Vital Signs in the last 24 hours: Temp:  [98.1 F (36.7 C)-99.6 F (37.6 C)] 98.4 F (36.9 C) (02/12 0728) Pulse Rate:  [66-77] 70 (02/12 0728) Resp:  [17-26] 21 (02/12 0728) BP: (121-142)/(57-83) 132/57 mmHg (02/12 0728) SpO2:  [95 %-99 %] 95 % (02/12 0728) Weight:  [150 lb 5.7 oz (68.2 kg)] 150 lb 5.7 oz (68.2 kg) (02/12 0500)  Intake/Output from previous day: 02/11 0701 - 02/12 0700 In: 243 [P.O.:240; I.V.:3] Out: -   Physical Exam: Pt is alert and oriented, NAD HEENT: normal Neck: JVP - normal Lungs: Diminished BS bases CV: RRR  Abd: soft, NT, Positive BS, no masses Ext: no edema Skin: warm/dry no rash   Current facility-administered medications:  .  acetaminophen (TYLENOL) tablet 650 mg, 650 mg, Oral, Q6H PRN, 650 mg at 09/19/15 1726 **OR** acetaminophen (TYLENOL) suppository 650 mg, 650 mg, Rectal, Q6H PRN, Asiyah Cletis Media, MD .  aspirin chewable tablet 81 mg, 81 mg, Oral, Daily, Asiyah Cletis Media, MD, 81 mg at 09/22/15 0958 .  clopidogrel (PLAVIX) tablet 75 mg, 75 mg, Oral, Q breakfast, Asiyah Cletis Media, MD, 75 mg at 09/22/15 0958 .  colchicine tablet 0.6 mg, 0.6 mg, Oral, Daily, Sherren Mocha, MD, 0.6 mg at 09/21/15 1030 .  diltiazem (CARDIZEM) tablet 60 mg, 60 mg, Oral, 3 times per day, Sanda Klein, MD, 60 mg at 09/22/15 0631 .  ibuprofen (ADVIL,MOTRIN) tablet 800 mg, 800 mg, Oral, TID, Sherren Mocha, MD, 800 mg at 09/22/15 0958 .  insulin aspart (novoLOG) injection 0-9 Units, 0-9 Units, Subcutaneous, TID WC, Asiyah Cletis Media, MD, 5 Units at 09/21/15 1706 .  latanoprost (XALATAN) 0.005 % ophthalmic solution 1 drop, 1 drop, Both Eyes, QHS, Verner Mould, MD, 1 drop at 09/21/15 2200 .  metoprolol tartrate (LOPRESSOR) tablet 12.5 mg, 12.5 mg, Oral, BID, Asiyah Cletis Media, MD, 12.5 mg at 09/22/15 0958 .  nitroGLYCERIN (NITROSTAT) SL tablet 0.4 mg, 0.4 mg, Sublingual, Q5  Min x 3 PRN, Asiyah Cletis Media, MD .  pantoprazole (PROTONIX) EC tablet 40 mg, 40 mg, Oral, Q1200, Kinnie Feil, MD, 40 mg at 09/21/15 1314 .  polyethylene glycol (MIRALAX / GLYCOLAX) packet 17 g, 17 g, Oral, Daily, Asiyah Cletis Media, MD, 17 g at 09/20/15 1115 .  sodium chloride flush (NS) 0.9 % injection 3 mL, 3 mL, Intravenous, Q12H, Asiyah Cletis Media, MD, 3 mL at 09/21/15 1000   Lab Results:  Recent Labs  09/20/15 0855 09/22/15 0711  WBC 10.7* 8.3  HGB 10.7* 10.8*  PLT 200 288    Recent Labs  09/22/15 0711  NA 141  K 4.5  CL 107  CO2 22  GLUCOSE 186*  BUN 18  CREATININE 0.80   No results for input(s): TROPONINI in the last 72 hours.  Invalid input(s): CK, MB  Cardiac Studies: 2D Echo 2/11:  Pericardium, extracardiac: small to moderate effusion without tamponade  Tele: Sinus rhythm,no further AF  Assessment/Plan:  1. 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  Much improved clinically and by echo No further inpatient workup advised Clinically improved on ibuprofen and colchicine   2. CAD: no angina. Stable on plavix and ASA  3. PAF: would keep off of eliquis until follow-up echo in 2 weeks.  At that time, could stop asa and start eliquis back  Dispo:   Discharge on following medicines today  Ibuprofen 800 mg TID  x 2 weeks  Protonix 40 mg daily while on ibuprofen  Colchicine 0.6 mg x 2 months  FU limited echo in 2 weeks FU Dr Radford Pax one week following DC  Cardiology team to see as needed while here. Please call with questions.   Thompson Grayer, M.D. 09/22/2015, 12:07 PM

## 2015-09-22 NOTE — Discharge Instructions (Signed)
Ms. Schorn, we admitted you because of your chest pain and fluid around your heart. We have been treating you for pericarditis. The fluid has improved (as seen on the heart ultrasound) and your symptoms have followed suit. You have been started on a high NSAID (ibuprofen) regimen to continue for 2 weeks since starting treatment. Please take this as directed in addition to your anti-reflux medication (Protonix) to help protect against ulcers. Please also continue taking Colchicine daily. You will need to follow-up with Cardiology in one week and get another heart ultrasound in two weeks. We have held your Eliquis for now to help prevent the fluid around your heart from becoming a bloody fluid. Risks and benefits were discussed with you about this. This will likely need restarting when you follow-up outside the hospital. If you have worsening symptoms, including chest pain, shortness of breath, or signs/symptoms of a stroke (one sided weakness, trouble speaking, etc), please return to the emergency department for evaluation.

## 2015-09-23 ENCOUNTER — Telehealth: Payer: Self-pay | Admitting: *Deleted

## 2015-09-23 ENCOUNTER — Other Ambulatory Visit: Payer: Self-pay | Admitting: *Deleted

## 2015-09-23 DIAGNOSIS — I3139 Other pericardial effusion (noninflammatory): Secondary | ICD-10-CM

## 2015-09-23 DIAGNOSIS — I313 Pericardial effusion (noninflammatory): Secondary | ICD-10-CM

## 2015-09-23 LAB — CULTURE, BLOOD (ROUTINE X 2)
Culture: NO GROWTH
Culture: NO GROWTH

## 2015-09-23 LAB — FANA STAINING PATTERNS: Homogeneous Pattern: 1:80 {titer}

## 2015-09-23 LAB — ANTINUCLEAR ANTIBODIES, IFA: ANA Ab, IFA: POSITIVE — AB

## 2015-09-23 MED ORDER — COLCHICINE 0.6 MG PO TABS: 0.6000 mg | ORAL_TABLET | ORAL | Status: DC

## 2015-09-23 NOTE — Telephone Encounter (Signed)
Return number for nursing not given. Discussed reaction with pharmacy and Cardiology, who recommended spacing Colchicine to every other day dosing to avoid interaction. Please let them know that she may take Colchicine 0.6mg  (one tablet) every other day.  Thanks!

## 2015-09-23 NOTE — Telephone Encounter (Signed)
[facility] in [place], [place], which is documented in [place] as primary pharmacy. States they were not the pharmacy that contacted [place] concerning this.  Contacted Mrs. Apgar and discussed dosing. States she had taken prescription to [facility] in [place], [place] and had filled everything but Colchicine. [place] who agreed with plan to space Colchicine to every other day dosing. Mrs. Melley to follow up in one week with family medicine doctor.

## 2015-09-23 NOTE — Discharge Summary (Signed)
[facility] Discharge Summary  Patient name: Brandi Frey Medical record number: [id number] Date of birth: [birth year] Age: 75 y.o. Gender: female Date of Admission: 09/18/2015  Date of Discharge: 09/22/2015 Admitting Physician: Kinnie Feil, MD  Primary Care Provider: [name], MD Consultants: Cardiology  Indication for Hospitalization: Pericarditis with pleural effusion Discharge Diagnoses/Problem List:  Hypretension Hyperlipidemia Type 2 diabetes Proxoysomal atrial fibrillation CAD SVT  Disposition: Home  Discharge Condition: Stable  Discharge Exam:  General: Well appearing, sitting up eating breakfast. no apparent distress Cardiovascular: RRR, no murmurs, rubs or gallops  Respiratory: CTAB, no wheezes, no increased work of breathing. Slightly diminished at bases Abdomen: BS+, no ttp, soft and nondistended  Ext: No lower extremity edema   Brief Hospital Course:  Patient presented with chest pain, worsened with lying down and improved with sitting up. She had a recent history of an NSTEMI December with stent placement. On admission patient was ruled out for ACS via negative troponins and normal EKG. CTA was done to determine presence of PE which was negative and was noted to have pericardial effusion of 2 cm as well as pleural effusion. Patient was found to have a leukocytosis and was febrile. Urine culture, Blood cultures, and  chest x-ray was negative for acute signs of infection. Flu was negative. 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  Autoimmune sources were investigated with rheumatoid factor only slightly elevated, ANA was also slightly elevated. Initial echo was positive for 2.3 cm pericardial effusion as well as hypo-mobility due to this effusion. Per cardiology patient was to be started on  ibuprofen and colchicine for pleuropericarditis during this admission. Patient had an episode of AF-RVR while in the hospital requiring a diltiazem drip, patient was then transitioned to Cardizem PO.  While in the hospital a repeat echo was performed to ensure that pericardial effusion was diminishing before discharge.  Per cardiology Eliqius was discontinued to avoid bleeding complications or hemorrhagic transformation of her pericardial effusion.    Issues for Follow Up:  1. Patient will need follow-up echo in 2 weeks 2. New medications include ibuprofen 3 times a day for two weeks and colchicine 0.6 mg for 2 months 3. Will continue Protonix while on ibuprofen 4. After echo, could reconsider starting patient back on Eliquis and then stopping aspirin at that time  5. Make sure patient follow up with Dr Radford Pax, Cardiology 6. Per note in chart patient also continued on Caridzem 60 mg TID, looks as if this prescription was called in   Significant Procedures:  ECHO 2/11 Study Conclusions  - Left ventricle: The cavity size was normal. Systolic function was normal. The estimated ejection fraction was in the range of 60% to 65%. Wall motion was normal; there were no regional wall motion abnormalities. Features are consistent with a pseudonormal left ventricular filling pattern, with concomitant abnormal relaxation and increased filling pressure (grade 2 diastolic dysfunction). - Aortic valve: There was no stenosis. There was trivial regurgitation. - Mitral valve: Mildly calcified annulus. There was mild regurgitation. - Right ventricle: The cavity size was normal. Systolic function was normal. - Tricuspid valve: Peak RV-RA gradient (S): 32 mm Hg. - Pulmonary arteries: PA peak pressure: 40 mm Hg (S). - Systemic veins: IVC measured 2.1 cm with normal respirophasic variation, suggesting RA pressure 8 mmHg. - Pericardium, extracardiac: Mild to moderate pericardial  effusion. There was mild indentation of the right  atrium but tamponade was not present.  Impressions:  - Normal LV size with EF 60-65%. Moderate diastolic dysfunction. Normal RV size and systolic function. Mild pulmonary hypertension. Small to moderate pericardial effusion without tamponade.  ECHO 2/9   - Left ventricle: The cavity size was normal. Wall thickness was normal. Systolic function was vigorous. The estimated ejection fraction was in the range of 65% to 70%. Wall motion was normal; there were no regional wall motion abnormalities. - Mitral valve: Calcified annulus. - Left atrium: The atrium was mildly dilated. - Pulmonary arteries: Systolic pressure was mildly increased. PA peak pressure: 31 mm Hg (S). - Pericardium, extracardiac: A large pericardial effusion was identified circumferential to the heart, along the right ventricular free wall, and along the right atrial free wall. There was moderateright atrial chamber collapse. There was moderate right ventricular chamber collapse noted in subcostal view. Features were consistent with tamponade physiology. There was a left pleural effusion. Maximum effusion diameter 2.3cm along RV and RA free wall.  Significant Labs and Imaging:   Recent Labs Lab 09/19/15 0702 09/20/15 0855 09/22/15 0711  WBC 14.6* 10.7* 8.3  HGB 11.3* 10.7* 10.8*  HCT 34.0* 32.1* 33.2*  PLT 215 200 288    Recent Labs Lab 09/18/15 1125 09/19/15 0702 09/22/15 0711  NA 136 135 141  K 4.4 4.1 4.5  CL 100* 100* 107  CO2 24 23 22   GLUCOSE 253* 217* 186*  BUN 10 9 18   CREATININE 0.76 0.73 0.80  CALCIUM 9.3 8.8* 8.9    Results/Tests Pending at Time of Discharge: None  Discharge Medications:    Medication List    STOP taking these medications        apixaban 5 MG Tabs tablet  Commonly known as:  ELIQUIS      TAKE these medications        acetaminophen 500 MG tablet  Commonly known as:  TYLENOL   Take 2 tablets (1,000 mg total) by mouth every 8 (eight) hours as needed for moderate pain.     aspirin EC 81 MG tablet  Take the last dose 09/07/15     atorvastatin 80 MG tablet  Commonly known as:  LIPITOR  Take 40 mg by mouth daily.     Cinnamon 500 MG Tabs  Take 1,000 mg by mouth 2 (two) times daily.     clopidogrel 75 MG tablet  Commonly known as:  PLAVIX  Take 1 tablet (75 mg total) by mouth daily with breakfast.     ibuprofen 800 MG tablet  Commonly known as:  ADVIL,MOTRIN  Take 1 tablet (800 mg total) by mouth 3 (three) times daily.     metFORMIN 500 MG tablet  Commonly known as:  GLUCOPHAGE  Take 1 tablet (500 mg total) by mouth daily after breakfast.     metoprolol succinate 25 MG 24 hr tablet  Commonly known as:  TOPROL-XL  Take 25 mg by mouth every morning.     nitroGLYCERIN 0.4 MG SL tablet  Commonly known as:  NITROSTAT  Place 1 tablet (0.4 mg total) under the tongue every 5 (five) minutes x 3 doses as needed for chest pain.     pantoprazole 40 MG tablet  Commonly known as:  PROTONIX  Take 1 tablet (40 mg total) by mouth daily at 12 noon.     Travoprost (BAK Free) 0.004 % Soln ophthalmic solution  Commonly known as:  TRAVATAN  Place 1 drop into both eyes at bedtime.  Discharge Instructions: Please refer to Patient Instructions section of EMR for full details.  Patient was counseled important signs and symptoms that should prompt return to medical care, changes in medications, dietary instructions, activity restrictions, and follow up appointments.   Follow-Up Appointments: Follow-up Information    Follow up with Sueanne Margarita, MD. Schedule an appointment as soon as possible for a visit in 1 week.   Specialty:  Cardiology   Why:  For hospital follow-up   Contact information:   [address] [place] [address] [phone]       Follow up with [name], MD. Schedule an appointment as soon as possible for a  visit in 1 week.   Specialty:  Family Medicine   Why:  For hospital follow-up   Contact information:   [place] [address] [id number]       Kimanh Templeman Cletis Media, MD 09/23/2015, 10:04 PM PGY-1, [place]

## 2015-09-23 NOTE — Telephone Encounter (Signed)
Inpatient nurse states pharmacy called her saying that colichine has a cross interaction with diltiazem. Would like to know if MD want to prescribe something else?

## 2015-09-30 ENCOUNTER — Encounter: Payer: Medicare Other | Admitting: Physician Assistant

## 2015-10-01 ENCOUNTER — Other Ambulatory Visit: Payer: Self-pay | Admitting: Family Medicine

## 2015-10-04 NOTE — Progress Notes (Signed)
Cardiology Office Note   Date:  10/07/2015   ID:  Brandi Frey, DOB July 29, 1941, MRN [id number]  PCP:  Brandi Olden, MD  Cardiologist:  Dr. Radford Frey    Post hospital follow up- pericarditis with pericardial effusion    History of Present Illness: Brandi Frey is a 75 y.o. female with a history of HTN, HLD, DM, CAD s/p DESx2 to LAD in 2009 and recent DES to LAD in 07/2015, mod MR by echo 07/2015, and recently diagnosed PAF and SVT who presents to clinic for post hospital follow up after recent admission for pleuropericarditis Frey/b pericardial effusion.  She was recently admitted to [facility] from 2/8-2/12/17. She initially presented to [facility] with fever, chill, nonproductive cough and intermittent pleuritic chest pain worse when she laying down. She was found to have pleuropericarditis complicated by pericardial effusion with echo features of tamponade. However, she remained clinically stable. Follow up limited echo revealed a small to moderate pericardial effusion without tamponade. She was started on high dose NSAIDS and colchicine and her pain improved. She did have small runs of afib but mostly remained in NSR. Eliquis stopped due to risk of hemorrhagic conversion of effusion. Would keep off Eliquis over next few weeks while on ASA, plavix, and high-dose ibuprofen. Diltiazem was added for better rate control. Limited ECHO performed today on 10/07/15 showed no pericardial effusion.   Today she presents to clinic for follow up. She has had no further chest pain. She is off the NSAIDS and PPI now. No blood in stool or urine. No dizziness or passing out. No LE edema, orthopnea, PND. She is feeling quite well and has even been out gardening. She is starting to increase her exercising.   Past Medical History  Diagnosis Date  . Hypertension   . Hyperlipidemia   . Coronary artery disease     a. 2009: DES x2 to LAD in [facility]   b. 07/2015: NSTEMI s/p DES to LAD.  Marland Kitchen SVT  (supraventricular tachycardia) ([facility])   . PAF (paroxysmal atrial fibrillation) (Brandi [facility]rey)     a. newly diagnosed 07/2015 in the setting of NSTEMI. Spont conv to NSR/SB. Started on Eliquis.  . Mitral regurgitation   . CAP (community acquired pneumonia) 09/18/2015  . Cancer of left breast ([facility]andi [facility]) 08/2002  . Myocardial infarction ([facility]) 08/07/2015    "very light one"  . Diabetes mellitus, type 2 [facility])     Past Surgical History  Procedure Laterality Date  . Breast biopsy Left 08/2002    "needle biopsy"  . Breast lumpectomy Left 08/2002  . Abdominal hysterectomy  1998  . Appendectomy  ~ 1960  . Dilation and curettage of uterus  1980s X 1    "lots of bleeding & passing clots"  . Tubal ligation  1970  . Coronary angioplasty with stent placement  2009    LAD x 2 with DES  . Cardiac catheterization N/A 08/07/2015    Procedure: Left Heart Cath and Coronary Angiography;  Surgeon: Brandi Blanks, MD;  Location: [facility];  Service: Cardiovascular;  Laterality: N/A;  . Cardiac catheterization N/A 08/07/2015    Procedure: Coronary Stent Intervention;  Surgeon: Brandi Blanks, MD;  Location: [facility];  Service: Cardiovascular;  Laterality: N/A;  mid LAD     Current Outpatient Prescriptions  Medication Sig Dispense Refill  . atorvastatin (LIPITOR) 80 MG tablet Take 40 mg by mouth daily.    . Cinnamon 500 MG TABS Take 1,000 mg by mouth 2 (  two) times daily.    . clopidogrel (PLAVIX) 75 MG tablet Take 1 tablet (75 mg total) by mouth daily with breakfast. 30 tablet 12  . colchicine 0.6 MG tablet Take 1 tablet (0.6 mg total) by mouth every other day. 30 tablet 0  . diltiazem (CARDIZEM) 60 MG tablet Take 1 tablet (60 mg total) by mouth every 8 (eight) hours. 90 tablet 0  . metFORMIN (GLUCOPHAGE) 500 MG tablet Take 1 tablet (500 mg total) by mouth daily after breakfast. 30 tablet 1  . metoprolol succinate (TOPROL-XL) 25 MG 24 hr tablet Take 25 mg by mouth every morning.  99  .  nitroGLYCERIN (NITROSTAT) 0.4 MG SL tablet Place 1 tablet (0.4 mg total) under the tongue every 5 (five) minutes x 3 doses as needed for chest pain. 25 tablet 11  . Travoprost, BAK Free, (TRAVATAN) 0.004 % SOLN ophthalmic solution Place 1 drop into both eyes at bedtime.    Marland Kitchen apixaban (ELIQUIS) 5 MG TABS tablet Take 1 tablet (5 mg total) by mouth 2 (two) times daily. 180 tablet 3   No current facility-administered medications for this visit.    Allergies:   Adhesive; Latex; and Metformin    Social History:  The patient  reports that she has never smoked. She has never used smokeless tobacco. She reports that she drinks alcohol. She reports that she does not use illicit drugs.   Family History:  The patient's family history includes CVA in her father; Diabetes in her brother; Heart attack in her brother; Heart disease in her brother; Hypertension in her sister; Ovarian cancer in her mother; Stroke in her father.    ROS:  Please see the history of present illness.   Otherwise, review of systems are positive for none.   All other systems are reviewed and negative.    PHYSICAL EXAM: VS:  BP 152/68 mmHg  Pulse 56  Ht 5\' 5"  (1.651 m)  Wt 148 lb 12.8 oz (67.495 kg)  BMI 24.76 kg/m2  SpO2 98% , BMI Body mass index is 24.76 kg/(m^2). GEN: Well nourished, well developed, in no acute distress HEENT: normal Neck: no JVD, R carotid bruit, or masses Cardiac: RR brady; no murmurs, rubs, or gallops,no edema  Respiratory:  clear to auscultation bilaterally, normal work of breathing GI: soft, nontender, nondistended, + BS MS: no deformity or atrophy Skin: warm and dry, no rash Neuro:  Strength and sensation are intact Psych: euthymic mood, full affect   EKG:  EKG is ordered today. The ekg ordered today demonstrates sinus bradycardia HR 52   Recent Labs: 08/07/2015: ALT 32; Magnesium 1.9 09/18/2015: TSH 3.937 09/22/2015: BUN 18; Creatinine, Ser 0.80; Hemoglobin 10.8*; Platelets 288; Potassium  4.5; Sodium 141    Lipid Panel    Component Value Date/Time   CHOL 108 09/20/2015 0720   TRIG 57 09/20/2015 0720   HDL 40* 09/20/2015 0720   CHOLHDL 2.7 09/20/2015 0720   VLDL 11 09/20/2015 0720   LDLCALC 57 09/20/2015 0720      Wt Readings from Last 3 Encounters:  10/07/15 148 lb 12.8 oz (67.495 kg)  09/22/15 150 lb 5.7 oz (68.2 kg)  08/14/15 147 lb (66.679 kg)      Other studies Reviewed: Additional studies/ records that were reviewed today include: 2D ECHO. Review of the above records demonstrates:     Echo 09/19/15 LV EF: 65% -  70% Study Conclusions - Left ventricle: The cavity size was normal. Wall thickness was normal. Systolic function was vigorous.  The estimated ejection fraction was in the range of 65% to 70%. Wall motion was normal; there were no regional wall motion abnormalities. - Mitral valve: Calcified annulus. - Left atrium: The atrium was mildly dilated. - Pulmonary arteries: Systolic pressure was mildly increased. PA peak pressure: 31 mm Hg (S). - Pericardium, extracardiac: A large pericardial effusion was identified circumferential to the heart, along the right ventricular free wall, and along the right atrial free wall. There was moderateright atrial chamber collapse. There was moderate right ventricular chamber collapse noted in subcostal view. Features were consistent with tamponade physiology. There was a left pleural effusion. Maximum effusion diameter 2.3cm along RV and RA free wall.   ASSESSMENT AND PLAN:  Brandi Frey is a 75 y.o. female with a history of HTN, HLD, DM, CAD s/p DESx2 to LAD in 2009 and recent DES to LAD in 07/2015, mod MR by echo 07/2015, and recently diagnosed PAF and SVT who presents to clinic for post hospital follow up after recent admission for pleuropericarditis Frey/b pericardial effusion.  Pleuropericarditis complicated by pericardial effusion with echo features of tamponade: much improved  clinically and by echo. Limited 2D ECHO today with no pericardial effusion. Awaiting formal read.  Clinically improved on ibuprofen and colchicine. She has completed the ibuprofen and remains on colchicine for 2 months total. Will stop the ASA and resume her Eliquis today  CAD: no angina. Stable on plavix and ASA, statin and BB.Marland Kitchen As above will stop the ASA and resume her Eliquis today. She will continue on plavix.  PAF: Eliquis was discontinued during her recent admission with pericarditis Frey/b pericardial effusion for concern of hemorrhagic conversion. Additionally, she was on high-dose NSAIDs.Resume Eliquis 5mg  BID for CHADSVASc of 5 (HTN 1, Age 49, DM 1, CAD 1, F sex 1). Continue dilt 60mg  q 8hours for rate control.   HTN: BP well controlled on current regimen.   HLD: cont statin  Mod MR: follow clinically.   Right carotid bruit: will get bilateral carotid dopplers.   Current medicines are reviewed at length with the patient today.  The patient does not have concerns regarding medicines.  The following changes have been made:  Stop aspirin and resume Eliquis 5mg  twice a day.   Labs/ tests ordered today include:   Orders Placed This Encounter  Procedures  . EKG 12-Lead     Disposition:   FU with Dr Brandi Frey as previously scheduled.  Renea Ee  10/07/2015 12:14 PM    [facility] [place], [place], [place]  [address] Phone: [phone]; Fax: [phone]

## 2015-10-07 ENCOUNTER — Other Ambulatory Visit: Payer: Self-pay | Admitting: Internal Medicine

## 2015-10-07 ENCOUNTER — Ambulatory Visit (INDEPENDENT_AMBULATORY_CARE_PROVIDER_SITE_OTHER): Payer: Medicare Other | Admitting: Physician Assistant

## 2015-10-07 ENCOUNTER — Ambulatory Visit (HOSPITAL_COMMUNITY): Payer: Medicare Other | Attending: Internal Medicine

## 2015-10-07 ENCOUNTER — Other Ambulatory Visit: Payer: Self-pay

## 2015-10-07 ENCOUNTER — Encounter: Payer: Self-pay | Admitting: Physician Assistant

## 2015-10-07 VITALS — BP 152/68 | HR 56 | Ht 65.0 in | Wt 148.8 lb

## 2015-10-07 DIAGNOSIS — I340 Nonrheumatic mitral (valve) insufficiency: Secondary | ICD-10-CM | POA: Diagnosis not present

## 2015-10-07 DIAGNOSIS — I517 Cardiomegaly: Secondary | ICD-10-CM | POA: Insufficient documentation

## 2015-10-07 DIAGNOSIS — I313 Pericardial effusion (noninflammatory): Secondary | ICD-10-CM

## 2015-10-07 DIAGNOSIS — I480 Paroxysmal atrial fibrillation: Secondary | ICD-10-CM

## 2015-10-07 DIAGNOSIS — I3139 Other pericardial effusion (noninflammatory): Secondary | ICD-10-CM

## 2015-10-07 DIAGNOSIS — I319 Disease of pericardium, unspecified: Secondary | ICD-10-CM | POA: Insufficient documentation

## 2015-10-07 DIAGNOSIS — R0989 Other specified symptoms and signs involving the circulatory and respiratory systems: Secondary | ICD-10-CM

## 2015-10-07 MED ORDER — APIXABAN 5 MG PO TABS: 5.0000 mg | ORAL_TABLET | Freq: Two times a day (BID) | ORAL | Status: DC

## 2015-10-07 NOTE — Patient Instructions (Signed)
Medication Instructions:  1) STOP Aspirin 2) STOP Protonix 3) START Eliquis 5mg  twice daily  Labwork: None  Testing/Procedures: Your physician has requested that you have a carotid duplex. This test is an ultrasound of the carotid arteries in your neck. It looks at blood flow through these arteries that supply the brain with blood. Allow one hour for this exam. There are no restrictions or special instructions.   Follow-Up: Keep follow up with [name] as previously scheduled.  Any Other Special Instructions Will Be Listed Below (If Applicable).     If you need a refill on your cardiac medications before your next appointment, please call your pharmacy.

## 2015-10-09 ENCOUNTER — Ambulatory Visit (HOSPITAL_COMMUNITY)
Admission: RE | Admit: 2015-10-09 | Discharge: 2015-10-09 | Disposition: A | Discharge status: Home / Self Care | Payer: Medicare Other | Source: Ambulatory Visit | Attending: Physician Assistant | Admitting: Physician Assistant

## 2015-10-09 DIAGNOSIS — I480 Paroxysmal atrial fibrillation: Secondary | ICD-10-CM | POA: Insufficient documentation

## 2015-10-09 DIAGNOSIS — E785 Hyperlipidemia, unspecified: Secondary | ICD-10-CM | POA: Diagnosis not present

## 2015-10-09 DIAGNOSIS — E119 Type 2 diabetes mellitus without complications: Secondary | ICD-10-CM | POA: Insufficient documentation

## 2015-10-09 DIAGNOSIS — I10 Essential (primary) hypertension: Secondary | ICD-10-CM | POA: Insufficient documentation

## 2015-10-09 DIAGNOSIS — R0989 Other specified symptoms and signs involving the circulatory and respiratory systems: Secondary | ICD-10-CM | POA: Diagnosis not present

## 2015-10-09 DIAGNOSIS — I6523 Occlusion and stenosis of bilateral carotid arteries: Secondary | ICD-10-CM | POA: Insufficient documentation

## 2015-10-10 ENCOUNTER — Telehealth: Payer: Self-pay | Admitting: Physician Assistant

## 2015-10-10 DIAGNOSIS — I2584 Coronary atherosclerosis due to calcified coronary lesion: Principal | ICD-10-CM

## 2015-10-10 DIAGNOSIS — I2510 Atherosclerotic heart disease of native coronary artery without angina pectoris: Secondary | ICD-10-CM

## 2015-10-10 DIAGNOSIS — Z0389 Encounter for observation for other suspected diseases and conditions ruled out: Secondary | ICD-10-CM

## 2015-10-10 NOTE — Telephone Encounter (Signed)
New Message   Pt returning phone call. Please call back and discuss.  

## 2015-10-10 NOTE — Telephone Encounter (Signed)
Returned pt's call. Pt aware of her Vas [place] and the need to repeat in 1 year. Pt verbalized understanding.  Recall and order put in [facility]

## 2015-10-10 NOTE — Telephone Encounter (Signed)
-----   Message from Eileen Stanford, PA-C sent at 10/09/2015 12:54 PM EST ----- Heterogeneous plaque, bilaterally. 40-59% bilateral ICA stenosis. Normal subclavian arteries, bilaterally. Patent vertebral arteries with antegrade flow.  Nothing to do right now- We will need to repeat studies in 1 year

## 2015-10-12 ENCOUNTER — Other Ambulatory Visit: Payer: Self-pay | Admitting: Physician Assistant

## 2015-10-28 ENCOUNTER — Other Ambulatory Visit: Payer: Self-pay | Admitting: Family Medicine

## 2015-10-29 ENCOUNTER — Telehealth: Payer: Self-pay | Admitting: Cardiology

## 2015-10-29 NOTE — Telephone Encounter (Signed)
Looks like this medication was ordered for the patient by a Dr Cordelia Poche. I do not see where Dr Radford Pax has ever filled this. Please advise. Thanks, MI

## 2015-10-29 NOTE — Telephone Encounter (Signed)
New message   *STAT* If patient is at the pharmacy, call can be transferred to refill team.   1. Which medications need to be refilled? (please list name of each medication and dose if known) diltiazem (CARDIZEM) 60 MG tablet   2. Which pharmacy/location (including street and city if local pharmacy) is medication to be sent to? [place] [address]  3. Do they need a 30 day or 90 day supply? 30 day supply. (takes 1 every 8 hours)

## 2015-10-31 NOTE — Telephone Encounter (Signed)
Ok to refill cardizem

## 2015-11-01 ENCOUNTER — Other Ambulatory Visit: Payer: Self-pay | Admitting: *Deleted

## 2015-11-01 MED ORDER — DILTIAZEM HCL 60 MG PO TABS: 60.0000 mg | ORAL_TABLET | Freq: Three times a day (TID) | ORAL | Status: DC

## 2015-11-07 ENCOUNTER — Encounter: Payer: Self-pay | Admitting: Cardiology

## 2015-11-07 ENCOUNTER — Ambulatory Visit (INDEPENDENT_AMBULATORY_CARE_PROVIDER_SITE_OTHER): Payer: Medicare Other | Admitting: Cardiology

## 2015-11-07 VITALS — BP 140/64 | HR 61 | Ht 65.0 in | Wt 148.4 lb

## 2015-11-07 DIAGNOSIS — E785 Hyperlipidemia, unspecified: Secondary | ICD-10-CM

## 2015-11-07 DIAGNOSIS — I480 Paroxysmal atrial fibrillation: Secondary | ICD-10-CM

## 2015-11-07 DIAGNOSIS — I319 Disease of pericardium, unspecified: Secondary | ICD-10-CM

## 2015-11-07 DIAGNOSIS — I2583 Coronary atherosclerosis due to lipid rich plaque: Secondary | ICD-10-CM

## 2015-11-07 DIAGNOSIS — I340 Nonrheumatic mitral (valve) insufficiency: Secondary | ICD-10-CM

## 2015-11-07 DIAGNOSIS — I3139 Other pericardial effusion (noninflammatory): Secondary | ICD-10-CM

## 2015-11-07 DIAGNOSIS — I6529 Occlusion and stenosis of unspecified carotid artery: Secondary | ICD-10-CM

## 2015-11-07 DIAGNOSIS — I313 Pericardial effusion (noninflammatory): Secondary | ICD-10-CM

## 2015-11-07 DIAGNOSIS — I2510 Atherosclerotic heart disease of native coronary artery without angina pectoris: Secondary | ICD-10-CM

## 2015-11-07 NOTE — Patient Instructions (Signed)
Medication Instructions:  Your physician recommends that you continue on your current medications as directed. Please refer to the Current Medication list given to you today.   Labwork: None  Testing/Procedures: Your physician has requested that you have an echocardiogram in one year. Echocardiography is a painless test that uses sound waves to create images of your heart. It provides your doctor with information about the size and shape of your heart and how well your heart's chambers and valves are working. This procedure takes approximately one hour. There are no restrictions for this procedure.   Your physician has requested that you have a carotid duplex in one year. This test is an ultrasound of the carotid arteries in your neck. It looks at blood flow through these arteries that supply the brain with blood. Allow one hour for this exam. There are no restrictions or special instructions.  Follow-Up: Your physician wants you to follow-up in: 6 months with [name]. You will receive a reminder letter in the mail two months in advance. If you don't receive a letter, please call our office to schedule the follow-up appointment.   Any Other Special Instructions Will Be Listed Below (If Applicable).     If you need a refill on your cardiac medications before your next appointment, please call your pharmacy.

## 2015-11-07 NOTE — Progress Notes (Signed)
Cardiology Office Note   Date:  11/07/2015   ID:  Brandi Frey, DOB Dec 18, 1940, MRN [id number]  PCP:  [name], MD    Chief Complaint  Patient presents with  . Pericardial Effusion  . Coronary Artery Disease  . Hypertension      History of Present Illness: Brandi Frey is a 75 y.o. female with a history of HTN, HLD, DM, CAD s/p DESx2 to LAD in 2009 and recent DES to LAD in 07/2015, mod MR by echo 07/2015, and recently diagnosed PAF and SVT who presents to clinic for follow up after recent admission for pleuropericarditis c/b pericardial effusion.  She was recently admitted to [facility] from 2/8-2/12/17. She initially presented to [facility] with fever, chill, nonproductive cough and intermittent pleuritic chest pain worse when she laying down. She was found to have pleuropericarditis complicated by pericardial effusion with echo features of tamponade. However, she remained clinically stable. Follow up limited echo revealed a small to moderate pericardial effusion without tamponade. She was started on high dose NSAIDS and colchicine and her pain improved. She did have small runs of afib but mostly remained in NSR. Eliquis stopped due to risk of hemorrhagic conversion of effusion.  Diltiazem was added for better rate control. Limited ECHO performed today on 10/07/15 showed no pericardial effusion.   Today she presents to clinic for follow up. She has had not any chest pain recently. She is off the NSAIDS and PPI now.  She denies any dizziness or syncope.  No LE edema, orthopnea, PND. She is working out in the yard without any problems.     Past Medical History  Diagnosis Date  . Hypertension   . Hyperlipidemia   . Coronary artery disease     a. 2009: DES x2 to LAD in [facility]   b. 07/2015: NSTEMI s/p DES to LAD.  Marland Kitchen SVT (supraventricular tachycardia) ([place])   . PAF (paroxysmal atrial fibrillation) ([place])     a. newly diagnosed 07/2015 in the setting  of NSTEMI. Spont conv to NSR/SB. Started on Eliquis.  . Mitral regurgitation   . CAP (community acquired pneumonia) 09/18/2015  . Cancer of left breast ([place]) 08/2002  . Myocardial infarction ([place]) 08/07/2015    "very light one"  . [facility]     Past Surgical History  Procedure Laterality Date  . Breast biopsy Left 08/2002    "needle biopsy"  . Breast lumpectomy Left 08/2002  . Abdominal hysterectomy  1998  . Appendectomy  ~ 1960  . Dilation and curettage of uterus  1980s X 1    "lots of bleeding & passing clots"  . Tubal ligation  1970  . Coronary angioplasty with stent placement  2009    LAD x 2 with DES  . Cardiac catheterization N/A 08/07/2015    Procedure: Left Heart Cath and Coronary Angiography;  Surgeon: Burnell Blanks, MD;  Location: [place] [facility];  Service: Cardiovascular;  Laterality: N/A;  . Cardiac catheterization N/A 08/07/2015    Procedure: Coronary Stent Intervention;  Surgeon: Burnell Blanks, MD;  Location: [place] [facility];  Service: Cardiovascular;  Laterality: N/A;  mid LAD     Current Outpatient Prescriptions  Medication Sig Dispense Refill  . apixaban (ELIQUIS) 5 MG TABS tablet Take 1 tablet (5 mg total) by mouth 2 (two) times daily. 180 tablet 3  . atorvastatin (LIPITOR) 80 MG tablet  Take 40 mg by mouth daily.    . Cinnamon 500 MG TABS Take 1,000 mg by mouth 2 (two) times daily.    . clopidogrel (PLAVIX) 75 MG tablet Take 1 tablet (75 mg total) by mouth daily with breakfast. 30 tablet 12  . diltiazem (CARDIZEM) 60 MG tablet Take 60 mg by mouth 3 (three) times daily.    . metFORMIN (GLUCOPHAGE) 500 MG tablet Take 1 tablet (500 mg total) by mouth daily after breakfast. 30 tablet 1  . metoprolol succinate (TOPROL-XL) 25 MG 24 hr tablet Take 25 mg by mouth every morning.  99  . nitroGLYCERIN (NITROSTAT) 0.4 MG SL tablet Place 1 tablet (0.4 mg total) under the tongue every 5 (five) minutes x 3 doses as needed for chest  pain. 25 tablet 11  . Travoprost, BAK Free, (TRAVATAN) 0.004 % SOLN ophthalmic solution Place 1 drop into both eyes at bedtime.     No current facility-administered medications for this visit.    Allergies:   Adhesive; Latex; and Metformin    Social History:  The patient  reports that she has never smoked. She has never used smokeless tobacco. She reports that she drinks alcohol. She reports that she does not use illicit drugs.   Family History:  The patient's family history includes CVA in her father; Diabetes in her brother; Heart attack in her brother; Heart disease in her brother; Hypertension in her sister; Ovarian cancer in her mother; Stroke in her father.    ROS:  Please see the history of present illness.   Otherwise, review of systems are positive for none.   All other systems are reviewed and negative.    PHYSICAL EXAM: VS:  BP 140/64 mmHg  Pulse 61  Ht 5\' 5"  (1.651 m)  Wt 148 lb 6.4 oz (67.314 kg)  BMI 24.70 kg/m2 , BMI Body mass index is 24.7 kg/(m^2). GEN: Well nourished, well developed, in no acute distress HEENT: normal Neck: no JVD, carotid bruits, or masses Cardiac: RRR; no murmurs, rubs, or gallops,no edema  Respiratory:  clear to auscultation bilaterally, normal work of breathing GI: soft, nontender, nondistended, + BS MS: no deformity or atrophy Skin: warm and dry, no rash Neuro:  Strength and sensation are intact Psych: euthymic mood, full affect   EKG:  EKG is not ordered today.    Recent Labs: 08/07/2015: ALT 32; Magnesium 1.9 09/18/2015: TSH 3.937 09/22/2015: BUN 18; Creatinine, Ser 0.80; Hemoglobin 10.8*; Platelets 288; Potassium 4.5; Sodium 141    Lipid Panel    Component Value Date/Time   CHOL 108 09/20/2015 0720   TRIG 57 09/20/2015 0720   HDL 40* 09/20/2015 0720   CHOLHDL 2.7 09/20/2015 0720   VLDL 11 09/20/2015 0720   LDLCALC 57 09/20/2015 0720      Wt Readings from Last 3 Encounters:  11/07/15 148 lb 6.4 oz (67.314 kg)  10/07/15  148 lb 12.8 oz (67.495 kg)  09/22/15 150 lb 5.7 oz (68.2 kg)     ASSESSMENT AND PLAN:  Brandi Frey is a 75 y.o. female with a history of HTN, HLD, DM, CAD s/p DESx2 to LAD in 2009 and recent DES to LAD in 07/2015, mod MR by echo 07/2015, and recently diagnosed PAF and SVT who presents to clinic for post hospital follow up after recent admission for pleuropericarditis c/b pericardial effusion.  Pleuropericarditis complicated by pericardial effusion with echo features of tamponade in setting of acute URI: much improved clinically and by echo. Limited 2D ECHO today with  trivial pericardial effusion.  She has completed the ibuprofen and colchicine and is back on Eliquis.  CAD: no angina. Stable on plavix, statin and BB.  She is not on ASA due to being on Plavix and Eliquis.   PAF: Eliquis was discontinued during her recent admission with pericarditis c/b pericardial effusion for concern of hemorrhagic conversion. Eliquis restarted for CHADSVASc of 5 (HTN 1, Age 52, DM 1, CAD 1, F sex 1). Continue dilt 60mg  q 8hours for rate control.   HTN: BP well controlled on current regimen. Continue BB.  HLD: cont statin. LDL at goal at 57.  Mild MR: follow clinically. Repeat echo in 1 year  Carotid artery stenosis - bilateral 40-59% stenosis - repeat dopplers in 1 year    Current medicines are reviewed at length with the patient today.  The patient does not have concerns regarding medicines.  The following changes have been made:  no change  Labs/ tests ordered today: See above Assessment and Plan No orders of the defined types were placed in this encounter.     Disposition:   FU with me in 6 months  Signed, Sueanne Margarita, MD  11/07/2015 10:32 AM    [facility] [address], [place], [place]  [address] Phone: [phone]; Fax: [phone]

## 2016-05-05 ENCOUNTER — Ambulatory Visit: Payer: Medicare Other | Admitting: Cardiology

## 2016-06-12 ENCOUNTER — Telehealth: Payer: Self-pay | Admitting: Cardiology

## 2016-06-12 NOTE — Telephone Encounter (Signed)
Received cardiac clearance request and permission to hold Eliquis (3 days) and Plavix (5 days) for microdiscectomy L4-L5 fpr ruptured disc under general anesthesia. Dr. Karie Kirks to do procedure at [facility].   Patient's daughter states the patient is in severe pain, she rarely can get comfortable to sleep, and she can hardly walk. She does not wish to wait until the end/beginning of the year (one year from stent) for the procedure given her discomfort. She states the patient went off the medication for a colonoscopy and did not have any problems. Informed her that it is highly advised AGAINST stopping Plavix during the first year after a stent. She requests [name] to allow her to hold the medication given her pain. Informed her that even if [name] "allows" her to hold her medication, it does not change her risk.   She requests a call back with Dr. Theodosia Blender recommendations.

## 2016-06-12 NOTE — Telephone Encounter (Signed)
New Message  Pts daughter calling to see how close we are on getting the approval due to pt is out of medication for back pain.  Please f/u

## 2016-06-12 NOTE — Telephone Encounter (Signed)
Please let patient know that she is ok to come off plavix and Eliquis  for surgery but since this less than a year post DES stent there is a slight chance of acute stent thrombosis.  She is cleared from cardiac standpoint with low to moderate risk of cardiac complications due to her underlying CAD but no prohibitive.

## 2016-06-12 NOTE — Telephone Encounter (Signed)
Informed patient of Dr. Theodosia Blender notes.   Faxed to Dr. Maricela Bo at [phone].

## 2016-06-12 NOTE — Telephone Encounter (Signed)
New message ° ° ° °Pt daughter verbalized that she is returning call for rn °

## 2016-07-09 ENCOUNTER — Ambulatory Visit: Payer: Medicare Other | Admitting: Cardiology

## 2016-07-17 ENCOUNTER — Encounter: Payer: Self-pay | Admitting: Cardiology

## 2016-07-17 ENCOUNTER — Encounter (INDEPENDENT_AMBULATORY_CARE_PROVIDER_SITE_OTHER): Payer: Self-pay

## 2016-07-17 ENCOUNTER — Ambulatory Visit (INDEPENDENT_AMBULATORY_CARE_PROVIDER_SITE_OTHER): Payer: Medicare Other | Admitting: Cardiology

## 2016-07-17 VITALS — BP 144/62 | HR 60 | Ht 65.0 in | Wt 146.0 lb

## 2016-07-17 DIAGNOSIS — I313 Pericardial effusion (noninflammatory): Secondary | ICD-10-CM

## 2016-07-17 DIAGNOSIS — I481 Persistent atrial fibrillation: Secondary | ICD-10-CM | POA: Diagnosis not present

## 2016-07-17 DIAGNOSIS — I10 Essential (primary) hypertension: Secondary | ICD-10-CM

## 2016-07-17 DIAGNOSIS — I4819 Other persistent atrial fibrillation: Secondary | ICD-10-CM

## 2016-07-17 DIAGNOSIS — I2510 Atherosclerotic heart disease of native coronary artery without angina pectoris: Secondary | ICD-10-CM | POA: Diagnosis not present

## 2016-07-17 DIAGNOSIS — I3139 Other pericardial effusion (noninflammatory): Secondary | ICD-10-CM

## 2016-07-17 MED ORDER — NITROGLYCERIN 0.4 MG SL SUBL: 0.4000 mg | SUBLINGUAL_TABLET | SUBLINGUAL | 3 refills | Status: DC | PRN

## 2016-07-17 MED ORDER — DILTIAZEM HCL 60 MG PO TABS: 60.0000 mg | ORAL_TABLET | Freq: Three times a day (TID) | ORAL | 11 refills | Status: DC

## 2016-07-17 MED ORDER — ATORVASTATIN CALCIUM 40 MG PO TABS: 40.0000 mg | ORAL_TABLET | Freq: Every day | ORAL | 11 refills | Status: DC

## 2016-07-17 MED ORDER — CLOPIDOGREL BISULFATE 75 MG PO TABS: 75.0000 mg | ORAL_TABLET | Freq: Every day | ORAL | 11 refills | Status: DC

## 2016-07-17 MED ORDER — METOPROLOL SUCCINATE ER 25 MG PO TB24: 25.0000 mg | ORAL_TABLET | Freq: Every morning | ORAL | 11 refills | Status: DC

## 2016-07-17 NOTE — Patient Instructions (Signed)
Medication Instructions:  Your physician recommends that you continue on your current medications as directed. Please refer to the Current Medication list given to you today.   Labwork: Your physician recommends that you have FASTING lab work in about [place].   Testing/Procedures: None  Follow-Up: Your physician wants you to follow-up in: 6 months with [name]. You will receive a reminder letter in the mail two months in advance. If you don't receive a letter, please call our office to schedule the follow-up appointment.   Any Other Special Instructions Will Be Listed Below (If Applicable).     If you need a refill on your cardiac medications before your next appointment, please call your pharmacy.

## 2016-07-17 NOTE — Progress Notes (Signed)
Cardiology Office Note    Date:  07/17/2016   ID:  Brandi Frey, DOB 1941/07/29, MRN [id number]  PCP:  [name], MD  Cardiologist:  Fransico Him, MD   Chief Complaint  Patient presents with  . Coronary Artery Disease  . Hypertension  . Atrial Fibrillation  . Hyperlipidemia    History of Present Illness:  Brandi Frey is a 75 y.o. 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  She presents to clinic for follow up today. She has had not any chest pain.  She denies any SOB, DOE, dizziness, palpitations or syncope.  No LE edema, orthopnea, PND. She is working out in the yard without any problems    Past Medical History:  Diagnosis Date  . Cancer of left breast ([place]) 08/2002  . CAP (community acquired pneumonia) 09/18/2015  . Coronary artery disease    a. 2009: DES x2 to LAD in [facility]   b. 07/2015: NSTEMI s/p DES to LAD.  . Diabetes mellitus, type 2 ([place])   . Hyperlipidemia   . Hypertension   . Mitral regurgitation   . Myocardial infarction 08/07/2015   "very light one"  . Persistent atrial fibrillation ([place])    a. newly diagnosed 07/2015 in the setting of NSTEMI. Spont conv to NSR/SB. Started on Eliquis for CHADS2VASC score of 5  . SVT (supraventricular tachycardia) ([facility])     Past Surgical History:  Procedure Laterality Date  . ABDOMINAL HYSTERECTOMY  1998  . APPENDECTOMY  ~ 1960  . BREAST BIOPSY Left 08/2002   "needle biopsy"  . BREAST LUMPECTOMY Left 08/2002  . CARDIAC CATHETERIZATION N/A 08/07/2015   Procedure: Left Heart Cath and Coronary Angiography;  Surgeon: Burnell Blanks, MD;  Location: [place] [facility];  Service: Cardiovascular;  Laterality: N/A;  . CARDIAC CATHETERIZATION N/A 08/07/2015   Procedure: Coronary Stent Intervention;  Surgeon: Burnell Blanks, MD;  Location: [place] [facility];  Service: Cardiovascular;   Laterality: N/A;  mid LAD  . CORONARY ANGIOPLASTY WITH STENT PLACEMENT  2009   LAD x 2 with DES  . DILATION AND CURETTAGE OF UTERUS  1980s X 1   "lots of bleeding & passing clots"  . TUBAL LIGATION  1970    Current Medications: Outpatient Medications Prior to Visit  Medication Sig Dispense Refill  . apixaban (ELIQUIS) 5 MG TABS tablet Take 1 tablet (5 mg total) by mouth 2 (two) times daily. 180 tablet 3  . atorvastatin (LIPITOR) 80 MG tablet Take 40 mg by mouth daily.    . Cinnamon 500 MG TABS Take 1,000 mg by mouth 2 (two) times daily.    . clopidogrel (PLAVIX) 75 MG tablet Take 1 tablet (75 mg total) by mouth daily with breakfast. 30 tablet 12  . diltiazem (CARDIZEM) 60 MG tablet Take 60 mg by mouth 3 (three) times daily.    . metFORMIN (GLUCOPHAGE) 500 MG tablet Take 1 tablet (500 mg total) by mouth daily after breakfast. 30 tablet 1  . metoprolol succinate (TOPROL-XL) 25 MG 24 hr tablet Take 25 mg by mouth every morning.  99  . nitroGLYCERIN (NITROSTAT) 0.4 MG SL tablet Place 1 tablet (0.4 mg total) under the tongue every 5 (five) minutes x 3 doses as needed for chest pain. 25 tablet 11  . Travoprost, BAK Free, (TRAVATAN) 0.004 % SOLN ophthalmic solution Place 1 drop into  both eyes at bedtime.     No facility-administered medications prior to visit.      Allergies:   Adhesive [tape]; Latex; and Metformin   Social History   Social History  . Marital status: Divorced    Spouse name: N/A  . Number of children: N/A  . Years of education: N/A   Social History Main Topics  . Smoking status: Never Smoker  . Smokeless tobacco: Never Used  . Alcohol use Yes     Comment: 09/18/2015 "might have a glass of wine a few times/year"  . Drug use: No  . Sexual activity: Not Currently   Other Topics Concern  . None   Social History Narrative  . None     Family History:  The patient's family history includes CVA in her father; Diabetes in her brother; Heart attack in her brother;  Heart disease in her brother; Hypertension in her sister; Ovarian cancer in her mother; Stroke in her father.   ROS:   Please see the history of present illness.    ROS All other systems reviewed and are negative.  No flowsheet data found.     PHYSICAL EXAM:   VS:  BP (!) 144/62   Pulse 60   Ht 5\' 5"  (1.651 m)   Wt 146 lb (66.2 kg)   SpO2 98%   BMI 24.30 kg/m    GEN: Well nourished, well developed, in no acute distress  HEENT: normal  Neck: no JVD, carotid bruits, or masses Cardiac: RRR; no murmurs, rubs, or gallops,no edema.  Intact distal pulses bilaterally.  Respiratory:  clear to auscultation bilaterally, normal work of breathing GI: soft, nontender, nondistended, + BS MS: no deformity or atrophy  Skin: warm and dry, no rash Neuro:  Alert and Oriented x 3, Strength and sensation are intact Psych: euthymic mood, full affect  Wt Readings from Last 3 Encounters:  07/17/16 146 lb (66.2 kg)  11/07/15 148 lb 6.4 oz (67.3 kg)  10/07/15 148 lb 12.8 oz (67.5 kg)      Studies/Labs Reviewed:   EKG:  EKG is not ordered today  Recent Labs: 08/07/2015: ALT 32; Magnesium 1.9 09/18/2015: TSH 3.937 09/22/2015: BUN 18; Creatinine, Ser 0.80; Hemoglobin 10.8; Platelets 288; Potassium 4.5; Sodium 141   Lipid Panel    Component Value Date/Time   CHOL 108 09/20/2015 0720   TRIG 57 09/20/2015 0720   HDL 40 (L) 09/20/2015 0720   CHOLHDL 2.7 09/20/2015 0720   VLDL 11 09/20/2015 0720   LDLCALC 57 09/20/2015 0720    Additional studies/ records that were reviewed today include:  none    ASSESSMENT:    1. Coronary artery disease involving native coronary artery of native heart without angina pectoris   2. Persistent atrial fibrillation ([place])   3. Essential hypertension   4. Pericardial effusion      PLAN:  In order of problems listed above:  1. ASCAD s/p DESx2 to LAD in 2009 and DES to LAD in 07/2015.  She has not had any further anginal symptoms.  Continue Plavix/statin  and BB.  She is not on ASA due to NOAC.   2. Persistent atrial fibrillation - maintaining NSR.  Continue apixaban/BB and Cardizem.  Check NOAC panel. 3. HTN - Bp controlled on current meds.  Continue BB and Cardizem. 4. Acute pericarditis with pericardial effusion - resolved. 5. Hyperlipidemia - LDL goal < 70.  Continue statin.  Check FLP and ALT.     Medication Adjustments/Labs and Tests Ordered:  Current medicines are reviewed at length with the patient today.  Concerns regarding medicines are outlined above.  Medication changes, Labs and Tests ordered today are listed in the Patient Instructions below.  There are no Patient Instructions on file for this visit.   Signed, Fransico Him, MD  07/17/2016 9:55 AM    [facility] [address], [place], [place]  [address] Phone: [phone]; Fax: [phone]

## 2016-08-11 ENCOUNTER — Telehealth: Payer: Self-pay | Admitting: Cardiology

## 2016-08-11 NOTE — Telephone Encounter (Signed)
Confirmed with DPR that patient may stop Plavix given that her PCI was over a year ago. She agrees with treatment plan and was grateful for call.  Med list updated.

## 2016-08-11 NOTE — Telephone Encounter (Signed)
OK to stop plavix given that her last PCI was over a year ago.

## 2016-08-11 NOTE — Telephone Encounter (Signed)
To [name] to decide which medication to hold- Plavix or Eliquis.

## 2016-08-11 NOTE — Telephone Encounter (Signed)
New Message  Daughter calling    Pt c/o medication issue: 1. Name of Medication: plavix 75 mg or eliquis 5 mg 2. How are you currently taking this medication (dosage and times per day)? Eliquis 2x a day plavix in am  3. Are you having a reaction (difficulty breathing--STAT)?  Eye bleed 4. What is your medication issue? Eye doctor wants her to stop one of the blood thinners until eye bleed clears up, which one do you want her to stop? Dr Jeanmarie Hubert [phone]

## 2016-10-26 ENCOUNTER — Telehealth: Payer: Self-pay | Admitting: Cardiology

## 2016-11-05 ENCOUNTER — Other Ambulatory Visit: Payer: Self-pay | Admitting: Physician Assistant

## 2016-11-11 ENCOUNTER — Ambulatory Visit (HOSPITAL_COMMUNITY): Payer: Medicare Other | Attending: Internal Medicine

## 2016-11-11 ENCOUNTER — Other Ambulatory Visit (HOSPITAL_COMMUNITY): Payer: Medicare Other

## 2016-11-11 ENCOUNTER — Ambulatory Visit (HOSPITAL_COMMUNITY)
Admission: RE | Admit: 2016-11-11 | Discharge: 2016-11-11 | Disposition: A | Discharge status: Home / Self Care | Payer: Medicare Other | Source: Ambulatory Visit | Attending: Cardiology | Admitting: Cardiology

## 2016-11-11 ENCOUNTER — Other Ambulatory Visit: Payer: Self-pay

## 2016-11-11 DIAGNOSIS — I6529 Occlusion and stenosis of unspecified carotid artery: Secondary | ICD-10-CM

## 2016-11-11 DIAGNOSIS — I2510 Atherosclerotic heart disease of native coronary artery without angina pectoris: Secondary | ICD-10-CM | POA: Insufficient documentation

## 2016-11-11 DIAGNOSIS — I10 Essential (primary) hypertension: Secondary | ICD-10-CM | POA: Insufficient documentation

## 2016-11-11 DIAGNOSIS — Z0389 Encounter for observation for other suspected diseases and conditions ruled out: Secondary | ICD-10-CM

## 2016-11-11 DIAGNOSIS — I071 Rheumatic tricuspid insufficiency: Secondary | ICD-10-CM | POA: Diagnosis not present

## 2016-11-11 DIAGNOSIS — E119 Type 2 diabetes mellitus without complications: Secondary | ICD-10-CM | POA: Insufficient documentation

## 2016-11-11 DIAGNOSIS — I6523 Occlusion and stenosis of bilateral carotid arteries: Secondary | ICD-10-CM | POA: Insufficient documentation

## 2016-11-11 DIAGNOSIS — I480 Paroxysmal atrial fibrillation: Secondary | ICD-10-CM | POA: Insufficient documentation

## 2016-11-11 DIAGNOSIS — E785 Hyperlipidemia, unspecified: Secondary | ICD-10-CM | POA: Insufficient documentation

## 2016-11-11 DIAGNOSIS — I340 Nonrheumatic mitral (valve) insufficiency: Secondary | ICD-10-CM | POA: Insufficient documentation

## 2016-11-12 ENCOUNTER — Encounter: Payer: Self-pay | Admitting: Cardiology

## 2016-11-12 ENCOUNTER — Telehealth: Payer: Self-pay | Admitting: Cardiology

## 2016-11-12 DIAGNOSIS — I739 Peripheral vascular disease, unspecified: Principal | ICD-10-CM

## 2016-11-12 DIAGNOSIS — I779 Disorder of arteries and arterioles, unspecified: Secondary | ICD-10-CM

## 2016-11-12 NOTE — Telephone Encounter (Signed)
New message   Pt is returning call to [facility] about results.

## 2016-11-12 NOTE — Telephone Encounter (Signed)
-----   Message from Sueanne Margarita, MD sent at 11/12/2016 10:30 AM EDT ----- 1-39% bilateral carotid stenosis - repeat in 1 year

## 2016-11-12 NOTE — Telephone Encounter (Signed)
Informed patient of results and verbal understanding expressed.  Repeat carotids ordered to be scheduled in 1 year. Patient agrees with treatment plan. 

## 2016-11-13 ENCOUNTER — Telehealth: Payer: Self-pay | Admitting: Cardiology

## 2016-11-13 NOTE — Telephone Encounter (Signed)
Informed patient she has been cleared for surgery.  Clearance to be sent to Dr. Karie Kirks at [facility] in [place].  Per patient, Dr. Thea Gist nurse, Lupita Dawn, is to be called at [phone] for fax number.

## 2016-11-13 NOTE — Telephone Encounter (Signed)
Per Roxanne, clearance to be faxed to [phone].

## 2016-11-13 NOTE — Telephone Encounter (Signed)
Patient has stable CAD with no angina on OV 07/2016.  Her echo this month showed normal LVF and carotid dopplers showed 1-39% bilaterally.  She has persistent atrial fibrillation and has been maintaining NSR.  OK to hold Eliquis for surgery.  She is low risk from a cardiac standpoint for surgery.

## 2017-01-12 ENCOUNTER — Telehealth: Payer: Self-pay | Admitting: Cardiology

## 2017-01-12 NOTE — Telephone Encounter (Signed)
Instructed patient to come fasting to appointment this Friday since labs were never drawn after last appointment. She was grateful for call and agrees with treatment plan.

## 2017-01-12 NOTE — Telephone Encounter (Signed)
Patient would like to verify if she needs to fast, thanks.

## 2017-01-15 ENCOUNTER — Ambulatory Visit (INDEPENDENT_AMBULATORY_CARE_PROVIDER_SITE_OTHER): Payer: Medicare Other | Admitting: Cardiology

## 2017-01-15 ENCOUNTER — Encounter: Payer: Self-pay | Admitting: Cardiology

## 2017-01-15 VITALS — BP 150/70 | HR 50 | Ht 65.0 in | Wt 150.0 lb

## 2017-01-15 DIAGNOSIS — E7800 Pure hypercholesterolemia, unspecified: Secondary | ICD-10-CM | POA: Diagnosis not present

## 2017-01-15 DIAGNOSIS — I2510 Atherosclerotic heart disease of native coronary artery without angina pectoris: Secondary | ICD-10-CM

## 2017-01-15 DIAGNOSIS — I10 Essential (primary) hypertension: Secondary | ICD-10-CM | POA: Diagnosis not present

## 2017-01-15 DIAGNOSIS — I779 Disorder of arteries and arterioles, unspecified: Secondary | ICD-10-CM

## 2017-01-15 DIAGNOSIS — I481 Persistent atrial fibrillation: Secondary | ICD-10-CM

## 2017-01-15 DIAGNOSIS — I4819 Other persistent atrial fibrillation: Secondary | ICD-10-CM

## 2017-01-15 DIAGNOSIS — I739 Peripheral vascular disease, unspecified: Secondary | ICD-10-CM

## 2017-01-15 LAB — CBC WITH DIFFERENTIAL/PLATELET
BASOS: 1 %
Basophils Absolute: 0 10*3/uL (ref 0.0–0.2)
EOS (ABSOLUTE): 0.3 10*3/uL (ref 0.0–0.4)
EOS: 4 %
HEMATOCRIT: 37.9 % (ref 34.0–46.6)
Hemoglobin: 13.1 g/dL (ref 11.1–15.9)
IMMATURE GRANS (ABS): 0 10*3/uL (ref 0.0–0.1)
IMMATURE GRANULOCYTES: 0 %
LYMPHS: 21 %
Lymphocytes Absolute: 1.7 10*3/uL (ref 0.7–3.1)
MCH: 28.4 pg (ref 26.6–33.0)
MCHC: 34.6 g/dL (ref 31.5–35.7)
MCV: 82 fL (ref 79–97)
Monocytes Absolute: 0.9 10*3/uL (ref 0.1–0.9)
Monocytes: 11 %
NEUTROS PCT: 63 %
Neutrophils Absolute: 5 10*3/uL (ref 1.4–7.0)
PLATELETS: 241 10*3/uL (ref 150–379)
RBC: 4.62 x10E6/uL (ref 3.77–5.28)
RDW: 14 % (ref 12.3–15.4)
WBC: 7.8 10*3/uL (ref 3.4–10.8)

## 2017-01-15 LAB — HEPATIC FUNCTION PANEL
ALBUMIN: 4.6 g/dL (ref 3.5–4.8)
ALT: 13 IU/L (ref 0–32)
AST: 17 IU/L (ref 0–40)
Alkaline Phosphatase: 83 IU/L (ref 39–117)
BILIRUBIN TOTAL: 0.7 mg/dL (ref 0.0–1.2)
BILIRUBIN, DIRECT: 0.16 mg/dL (ref 0.00–0.40)
Total Protein: 7.6 g/dL (ref 6.0–8.5)

## 2017-01-15 LAB — BASIC METABOLIC PANEL
BUN/Creatinine Ratio: 22 (ref 12–28)
BUN: 17 mg/dL (ref 8–27)
CALCIUM: 9.6 mg/dL (ref 8.7–10.3)
CO2: 21 mmol/L (ref 18–29)
Chloride: 101 mmol/L (ref 96–106)
Creatinine, Ser: 0.79 mg/dL (ref 0.57–1.00)
GFR calc Af Amer: 86 mL/min/{1.73_m2} (ref >59)
GFR, EST NON AFRICAN AMERICAN: 74 mL/min/{1.73_m2} (ref >59)
GLUCOSE: 142 mg/dL — AB (ref 65–99)
Potassium: 5 mmol/L (ref 3.5–5.2)
Sodium: 140 mmol/L (ref 134–144)

## 2017-01-15 LAB — LIPID PANEL
CHOL/HDL RATIO: 2.2 ratio (ref 0.0–4.4)
Cholesterol, Total: 145 mg/dL (ref 100–199)
HDL: 66 mg/dL (ref >39)
LDL Calculated: 64 mg/dL (ref 0–99)
Triglycerides: 74 mg/dL (ref 0–149)
VLDL CHOLESTEROL CAL: 15 mg/dL (ref 5–40)

## 2017-01-15 MED ORDER — METOPROLOL SUCCINATE ER 25 MG PO TB24: 12.5000 mg | ORAL_TABLET | Freq: Every morning | ORAL | 11 refills | Status: DC

## 2017-01-15 NOTE — Progress Notes (Signed)
Cardiology Office Note    Date:  01/15/2017   ID:  Brandi Frey, DOB 11-09-1940, MRN [id number]  PCP:  [name], MD  Cardiologist:  Fransico Him, MD   Chief Complaint  Patient presents with  . Coronary Artery Disease  . Hypertension  . Hyperlipidemia  . Atrial Fibrillation    History of Present Illness:  Brandi Frey is a 76 y.o. 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  She presents to clinic for follow up today. She denies any chest pain or pressure, SOB, DOE, PND, orthopnea, dizziness, LE edema, palpitations or syncope.    Past Medical History:  Diagnosis Date  . Cancer of left breast ([place]) 08/2002  . CAP (community acquired pneumonia) 09/18/2015  . Carotid arterial disease (HCC)    1-39% bilaterally by dopplers 11/2016  . Coronary artery disease    a. 2009: DES x2 to LAD in [facility]   b. 07/2015: NSTEMI s/p DES to LAD.  . Diabetes mellitus, type 2 ([place])   . Hyperlipidemia   . Hypertension   . Mitral regurgitation   . Myocardial infarction ([place]) 08/07/2015   "very light one"  . Persistent atrial fibrillation ([place])    a. newly diagnosed 07/2015 in the setting of NSTEMI. Spont conv to NSR/SB. Started on Eliquis for CHADS2VASC score of 5  . SVT (supraventricular tachycardia) (HCC)     Past Surgical History:  Procedure Laterality Date  . ABDOMINAL HYSTERECTOMY  1998  . APPENDECTOMY  ~ 1960  . BREAST BIOPSY Left 08/2002   "needle biopsy"  . BREAST LUMPECTOMY Left 08/2002  . CARDIAC CATHETERIZATION N/A 08/07/2015   Procedure: Left Heart Cath and Coronary Angiography;  Surgeon: Burnell Blanks, MD;  Location: [facility];  Service: Cardiovascular;  Laterality: N/A;  . CARDIAC CATHETERIZATION N/A 08/07/2015   Procedure: Coronary Stent Intervention;  Surgeon: Burnell Blanks, MD;  Location: [facility];   Service: Cardiovascular;  Laterality: N/A;  mid LAD  . CORONARY ANGIOPLASTY WITH STENT PLACEMENT  2009   LAD x 2 with DES  . DILATION AND CURETTAGE OF UTERUS  1980s X 1   "lots of bleeding & passing clots"  . TUBAL LIGATION  1970    Current Medications: Current Meds  Medication Sig  . atorvastatin (LIPITOR) 40 MG tablet Take 1 tablet (40 mg total) by mouth daily.  . Cinnamon 500 MG TABS Take 1,000 mg by mouth 2 (two) times daily.  Marland Kitchen diltiazem (CARDIZEM) 60 MG tablet Take 1 tablet (60 mg total) by mouth 3 (three) times daily.  Marland Kitchen ELIQUIS 5 MG TABS tablet TAKE 1 TABLET BY MOUTH TWO TIMES A DAY.  Marland Kitchen latanoprost (XALATAN) 0.005 % ophthalmic solution Place 1 drop into both eyes at bedtime.  . metFORMIN (GLUCOPHAGE) 500 MG tablet Take 500 mg by mouth 2 (two) times daily with a meal.  . metoprolol succinate (TOPROL-XL) 25 MG 24 hr tablet Take 1 tablet (25 mg total) by mouth every morning.  . nitroGLYCERIN (NITROSTAT) 0.4 MG SL tablet Place 1 tablet (0.4 mg total) under the tongue every 5 (five) minutes x 3 doses as needed for chest pain.  . Travoprost, BAK Free, (TRAVATAN) 0.004 % SOLN ophthalmic solution Place 1 drop into both eyes at bedtime.  . [DISCONTINUED] metFORMIN (GLUCOPHAGE) 500 MG tablet Take 1 tablet (500 mg total) by mouth daily  after breakfast.    Allergies:   Adhesive [tape]; Latex; and Metformin   Social History   Social History  . Marital status: Divorced    Spouse name: N/A  . Number of children: N/A  . Years of education: N/A   Social History Main Topics  . Smoking status: Never Smoker  . Smokeless tobacco: Never Used  . Alcohol use Yes     Comment: 09/18/2015 "might have a glass of wine a few times/year"  . Drug use: No  . Sexual activity: Not Currently   Other Topics Concern  . None   Social History Narrative  . None     Family History:  The patient's family history includes CVA in her father; Diabetes in her brother; Heart attack in her brother; Heart  disease in her brother; Hypertension in her sister; Ovarian cancer in her mother; Stroke in her father.   ROS:   Please see the history of present illness.    ROS All other systems reviewed and are negative.  No flowsheet data found.     PHYSICAL EXAM:   VS:  BP (!) 150/70   Pulse (!) 50   Ht 5\' 5"  (1.651 m)   Wt 150 lb (68 kg)   SpO2 98%   BMI 24.96 kg/m    GEN: Well nourished, well developed, in no acute distress  HEENT: normal  Neck: no JVD, carotid bruits, or masses Cardiac: RRR; no murmurs, rubs, or gallops,no edema.  Intact distal pulses bilaterally.  Respiratory:  clear to auscultation bilaterally, normal work of breathing GI: soft, nontender, nondistended, + BS MS: no deformity or atrophy  Skin: warm and dry, no rash Neuro:  Alert and Oriented x 3, Strength and sensation are intact Psych: euthymic mood, full affect  Wt Readings from Last 3 Encounters:  01/15/17 150 lb (68 kg)  07/17/16 146 lb (66.2 kg)  11/07/15 148 lb 6.4 oz (67.3 kg)      Studies/Labs Reviewed:   EKG:  EKG is ordered today.  The ekg ordered today demonstrates sinus bradycardia at 46bpm with no ST changes   Recent Labs: No results found for requested labs within last 8760 hours.   Lipid Panel    Component Value Date/Time   CHOL 108 09/20/2015 0720   TRIG 57 09/20/2015 0720   HDL 40 (L) 09/20/2015 0720   CHOLHDL 2.7 09/20/2015 0720   VLDL 11 09/20/2015 0720   LDLCALC 57 09/20/2015 0720    Additional studies/ records that were reviewed today include:  none    ASSESSMENT:    1. Coronary artery disease involving native coronary artery of native heart without angina pectoris   2. Persistent atrial fibrillation ([place])   3. Essential hypertension   4. Bilateral carotid artery disease ([place])   5. Pure hypercholesterolemia      PLAN:  In order of problems listed above:  1. ASCAD - s/p DESx2 to LAD in 2009 and DES to LAD in 07/2015.  She has not had any anginal symptoms.  She will  continue on BB, statin.  She is not on ASA due to NOAC.  I will check a NOAC panel. 2. Persistent atrial fibrillation - she is maintaining NSR.  She will continue on Toprol XL and Cardizem 60mg  TID as well as Eliquis. 3. HTN - her BP is borderline controlled on exam today.  She will continue on Toprol XL.  I have asked her to check her BP daily for a week and call with the  results. 4. Bilateral carotid artery disease (1 39% by dopplers 11/2016) - she will continue on statin. 5. Hyperlipidemia with LDL goal < 70.  She will continue on statin.  I will check an FLP and ALT.      Medication Adjustments/Labs and Tests Ordered: Current medicines are reviewed at length with the patient today.  Concerns regarding medicines are outlined above.  Medication changes, Labs and Tests ordered today are listed in the Patient Instructions below.  There are no Patient Instructions on file for this visit.   Signed, Fransico Him, MD  01/15/2017 9:15 AM    [address], [place], [place]  [id number] Phone: [phone]; Fax: [phone]

## 2017-01-15 NOTE — Patient Instructions (Signed)
Medication Instructions:  1) DECREASE TOPROL to 12.5 mg daily  Labwork: TODAY: BMET, CBC, LFTs, Lipids  Testing/Procedures: None  Follow-Up: Your physician wants you to follow-up in: 6 months with [name]. You will receive a reminder letter in the mail two months in advance. If you don't receive a letter, please call our office to schedule the follow-up appointment.   Any Other Special Instructions Will Be Listed Below (If Applicable).     If you need a refill on your cardiac medications before your next appointment, please call your pharmacy.

## 2017-02-01 ENCOUNTER — Telehealth: Payer: Self-pay | Admitting: Cardiology

## 2017-02-01 NOTE — Telephone Encounter (Signed)
New Message ° ° pt verbalized that she is returning call for rn °

## 2017-02-01 NOTE — Telephone Encounter (Signed)
Left message to call back  

## 2017-02-02 NOTE — Telephone Encounter (Signed)
Patient called to inform [name] of her BP's and HR's after her recent medication changes. Informed patient that her readings were looking good, and they will be sent to [name] to review. Patient verbalized understanding.  6/14  BP 114/71  HR 58   6/15  BP 136/79  HR 53 6/16  BP 119/73  HR 54 6/18  BP 123/69  HR 62 6/19  BP 124/77  HR 58 6/20  BP 130/79  HR 58

## 2017-02-02 NOTE — Telephone Encounter (Signed)
Follow up ° ° ° °Pt is returning call from yesterday. °

## 2017-02-03 NOTE — Telephone Encounter (Signed)
Stable BP no change

## 2017-02-03 NOTE — Telephone Encounter (Signed)
Pt is aware that Dr Radford Pax reviewed the series of BP numbers, and states that BP is stable and no changes. Pt verbalized understanding.

## 2017-06-24 ENCOUNTER — Other Ambulatory Visit: Payer: Self-pay | Admitting: Cardiology

## 2017-06-24 MED ORDER — DILTIAZEM HCL 60 MG PO TABS: 60.0000 mg | ORAL_TABLET | Freq: Three times a day (TID) | ORAL | 7 refills | Status: DC

## 2017-07-09 ENCOUNTER — Other Ambulatory Visit: Payer: Self-pay

## 2017-07-09 MED ORDER — ATORVASTATIN CALCIUM 40 MG PO TABS: 40.0000 mg | ORAL_TABLET | Freq: Every day | ORAL | 11 refills | Status: DC

## 2017-07-12 ENCOUNTER — Other Ambulatory Visit: Payer: Self-pay

## 2017-07-12 MED ORDER — ATORVASTATIN CALCIUM 40 MG PO TABS: 40.0000 mg | ORAL_TABLET | Freq: Every day | ORAL | 6 refills | Status: DC

## 2017-08-01 NOTE — Progress Notes (Signed)
Cardiology Office Note:    Date:  08/02/2017   ID:  Brandi Frey, DOB [birth year], MRN [id number]  PCP:  [name], MD  Cardiologist:  Fransico Him, MD   Referring MD: Yong Channel*   Chief Complaint  Patient presents with  . Coronary Artery Disease  . Hypertension  . Atrial Fibrillation    History of Present Illness:    Brandi Frey is a 76 y.o. 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  She denies any chest pain or pressure, SOB, DOE, PND, orthopnea, LE edema, dizziness, palpitations or syncope. She is compliant with her meds and is tolerating meds with no SE.     Past Medical History:  Diagnosis Date  . Cancer of left breast ([place]) 08/2002  . CAP (community acquired pneumonia) 09/18/2015  . Carotid arterial disease (HCC)    1-39% bilaterally by dopplers 11/2016  . Coronary artery disease    a. 2009: DES x2 to LAD in [facility]   b. 07/2015: NSTEMI s/p DES to LAD.  . Diabetes mellitus, type 2 ([place])   . Hyperlipidemia   . Hypertension   . Mitral regurgitation   . Myocardial infarction ([place]) 08/07/2015   "very light one"  . Persistent atrial fibrillation ([place])    a. newly diagnosed 07/2015 in the setting of NSTEMI. Spont conv to NSR/SB. Started on Eliquis for CHADS2VASC score of 5  . SVT (supraventricular tachycardia) (HCC)     Past Surgical History:  Procedure Laterality Date  . ABDOMINAL HYSTERECTOMY  1998  . APPENDECTOMY  ~ 1960  . BREAST BIOPSY Left 08/2002   "needle biopsy"  . BREAST LUMPECTOMY Left 08/2002  . CARDIAC CATHETERIZATION N/A 08/07/2015   Procedure: Left Heart Cath and Coronary Angiography;  Surgeon: Burnell Blanks, MD;  Location: [facility];  Service: Cardiovascular;  Laterality: N/A;  . CARDIAC CATHETERIZATION N/A 08/07/2015   Procedure: Coronary  Stent Intervention;  Surgeon: Burnell Blanks, MD;  Location: [facility];  Service: Cardiovascular;  Laterality: N/A;  mid LAD  . CORONARY ANGIOPLASTY WITH STENT PLACEMENT  2009   LAD x 2 with DES  . DILATION AND CURETTAGE OF UTERUS  1980s X 1   "lots of bleeding & passing clots"  . TUBAL LIGATION  1970    Current Medications: Current Meds  Medication Sig  . glipiZIDE (GLUCOTROL XL) 5 MG 24 hr tablet Take 1 tablet by mouth at bedtime.     Allergies:   Adhesive [tape]; Latex; and Metformin   Social History   Socioeconomic History  . Marital status: Divorced    Spouse name: None  . Number of children: None  . Years of education: None  . Highest education level: None  Social Needs  . Financial resource strain: None  . Food insecurity - worry: None  . Food insecurity - inability: None  . Transportation needs - medical: None  . Transportation needs - non-medical: None  Occupational History  . None  Tobacco Use  . Smoking status: Never Smoker  . Smokeless tobacco: Never Used  Substance and Sexual Activity  . Alcohol use: Yes    Comment: 09/18/2015 "might have a glass of wine a few times/year"  . Drug use: No  . Sexual activity: Not Currently  Other Topics Concern  .  None  Social History Narrative  . None     Family History: The patient's family history includes CVA in her father; Diabetes in her brother; Heart attack in her brother; Heart disease in her brother; Hypertension in her sister; Ovarian cancer in her mother; Stroke in her father.  ROS:   Please see the history of present illness.    ROS  All other systems reviewed and negative.   EKGs/Labs/Other Studies Reviewed:    The following studies were reviewed today: none  EKG:  EKG is not ordered today.    Recent Labs: 01/15/2017: ALT 13; BUN 17; Creatinine, Ser 0.79; Hemoglobin 13.1; Platelets 241; Potassium 5.0; Sodium 140   Recent Lipid Panel    Component Value Date/Time   CHOL 145  01/15/2017 0930   TRIG 74 01/15/2017 0930   HDL 66 01/15/2017 0930   CHOLHDL 2.2 01/15/2017 0930   CHOLHDL 2.7 09/20/2015 0720   VLDL 11 09/20/2015 0720   LDLCALC 64 01/15/2017 0930    Physical Exam:    VS:  BP (!) 152/74   Pulse (!) 59   Ht 5\' 5"  (1.651 m)   Wt 160 lb (72.6 kg)   SpO2 98%   BMI 26.63 kg/m     Wt Readings from Last 3 Encounters:  08/02/17 160 lb (72.6 kg)  01/15/17 150 lb (68 kg)  07/17/16 146 lb (66.2 kg)     GEN:  Well nourished, well developed in no acute distress HEENT: Normal NECK: No JVD; No carotid bruits LYMPHATICS: No lymphadenopathy CARDIAC: RRR, no murmurs, rubs, gallops RESPIRATORY:  Clear to auscultation without rales, wheezing or rhonchi  ABDOMEN: Soft, non-tender, non-distended MUSCULOSKELETAL:  No edema; No deformity  SKIN: Warm and dry NEUROLOGIC:  Alert and oriented x 3 PSYCHIATRIC:  Normal affect   ASSESSMENT:    1. Coronary artery disease involving native coronary artery of native heart without angina pectoris   2. Persistent atrial fibrillation ([place])   3. Essential hypertension   4. Bilateral carotid artery stenosis   5. Pure hypercholesterolemia    PLAN:    In order of problems listed above:  1.  ASCAD - s/p DESx2 to LAD in 2009 and DES to LAD in 07/2015.  She denies any anginal symptoms and tolerates her medications well.  She will continue on BB and statin.  She is not on ASA due to DOAC.   2.  Persistent atrial fibrillation - she continues to maintain NSR with no palpitations.  She will continue on Eliquis 5mg  BID for CADS2VASC score of 5.  She will continue on Toprol XL 12.5mg  daily and Cardizem 60mg  TID.  I will check a BMET and CBC today.  3.  HTN - BP is borderline controlled on exam today.  She has not taken her BP meds yet today.  She will continue on BB and CCB .  4.  Bilateral carotid artery disease - mild with 1-39% bilateral carotid stenosis.  Continue statin.    5.  Hyperlipidemia with LDL goal < 70.  She  will continue on atorvastatin 40mg  daily.   LDL was at goal at 64 01/2017.  I will get an FLP and ALT today.     Medication Adjustments/Labs and Tests Ordered: Current medicines are reviewed at length with the patient today.  Concerns regarding medicines are outlined above.  No orders of the defined types were placed in this encounter.  No orders of the defined types were placed in this encounter.   Signed, Tressia Miners  Radford Pax, MD  08/02/2017 8:09 AM    [facility]

## 2017-08-02 ENCOUNTER — Encounter (INDEPENDENT_AMBULATORY_CARE_PROVIDER_SITE_OTHER): Payer: Self-pay

## 2017-08-02 ENCOUNTER — Ambulatory Visit: Payer: Medicare Other | Admitting: Cardiology

## 2017-08-02 ENCOUNTER — Encounter: Payer: Self-pay | Admitting: Cardiology

## 2017-08-02 VITALS — BP 152/74 | HR 59 | Ht 65.0 in | Wt 160.0 lb

## 2017-08-02 DIAGNOSIS — I6523 Occlusion and stenosis of bilateral carotid arteries: Secondary | ICD-10-CM

## 2017-08-02 DIAGNOSIS — I481 Persistent atrial fibrillation: Secondary | ICD-10-CM

## 2017-08-02 DIAGNOSIS — I4819 Other persistent atrial fibrillation: Secondary | ICD-10-CM

## 2017-08-02 DIAGNOSIS — I2510 Atherosclerotic heart disease of native coronary artery without angina pectoris: Secondary | ICD-10-CM

## 2017-08-02 DIAGNOSIS — E7800 Pure hypercholesterolemia, unspecified: Secondary | ICD-10-CM

## 2017-08-02 DIAGNOSIS — I10 Essential (primary) hypertension: Secondary | ICD-10-CM | POA: Diagnosis not present

## 2017-08-02 NOTE — Patient Instructions (Signed)
Medication Instructions:  Your physician recommends that you continue on your current medications as directed. Please refer to the Current Medication list given to you today.  Labwork: Today for basic metabolic panel, complete blood count, liver function, fasting lipids, and A1C  Testing/Procedures: None ordered   Follow-Up: Your physician wants you to follow-up in: 6 month with [name]. You will receive a reminder letter in the mail two months in advance. If you don't receive a letter, please call our office to schedule the follow-up appointment.  Any Other Special Instructions Will Be Listed Below (If Applicable).     If you need a refill on your cardiac medications before your next appointment, please call your pharmacy.

## 2017-08-03 LAB — LIPID PANEL
CHOL/HDL RATIO: 2.3 ratio (ref 0.0–4.4)
Cholesterol, Total: 155 mg/dL (ref 100–199)
HDL: 68 mg/dL (ref >39)
LDL CALC: 69 mg/dL (ref 0–99)
TRIGLYCERIDES: 90 mg/dL (ref 0–149)
VLDL CHOLESTEROL CAL: 18 mg/dL (ref 5–40)

## 2017-08-03 LAB — HEPATIC FUNCTION PANEL
ALT: 14 IU/L (ref 0–32)
AST: 16 IU/L (ref 0–40)
Albumin: 4.3 g/dL (ref 3.5–4.8)
Alkaline Phosphatase: 100 IU/L (ref 39–117)
BILIRUBIN, DIRECT: 0.17 mg/dL (ref 0.00–0.40)
Bilirubin Total: 0.6 mg/dL (ref 0.0–1.2)
Total Protein: 7.4 g/dL (ref 6.0–8.5)

## 2017-08-03 LAB — CBC
HEMATOCRIT: 39 % (ref 34.0–46.6)
HEMOGLOBIN: 13.5 g/dL (ref 11.1–15.9)
MCH: 28.5 pg (ref 26.6–33.0)
MCHC: 34.6 g/dL (ref 31.5–35.7)
MCV: 83 fL (ref 79–97)
Platelets: 238 10*3/uL (ref 150–379)
RBC: 4.73 x10E6/uL (ref 3.77–5.28)
RDW: 13.7 % (ref 12.3–15.4)
WBC: 9 10*3/uL (ref 3.4–10.8)

## 2017-08-03 LAB — BASIC METABOLIC PANEL
BUN/Creatinine Ratio: 19 (ref 12–28)
BUN: 19 mg/dL (ref 8–27)
CALCIUM: 9.6 mg/dL (ref 8.7–10.3)
CHLORIDE: 104 mmol/L (ref 96–106)
CO2: 21 mmol/L (ref 20–29)
Creatinine, Ser: 1 mg/dL (ref 0.57–1.00)
GFR calc Af Amer: 64 mL/min/{1.73_m2} (ref >59)
GFR calc non Af Amer: 56 mL/min/{1.73_m2} — ABNORMAL LOW (ref >59)
GLUCOSE: 121 mg/dL — AB (ref 65–99)
Potassium: 4.3 mmol/L (ref 3.5–5.2)
Sodium: 141 mmol/L (ref 134–144)

## 2017-08-03 LAB — HEMOGLOBIN A1C
ESTIMATED AVERAGE GLUCOSE: 154 mg/dL
Hgb A1c MFr Bld: 7 % — ABNORMAL HIGH (ref 4.8–5.6)

## 2017-11-02 ENCOUNTER — Telehealth: Payer: Self-pay | Admitting: Cardiology

## 2017-11-02 NOTE — Telephone Encounter (Signed)
New message  Pt calling because she recioeved a letter about having a carotid done but states she was told she didn't have to have one this year. Please call

## 2017-11-02 NOTE — Telephone Encounter (Signed)
I spoke with patient and explained per carotid results from 11/25/17 that [name] is recommending repeating carotid US again this year. Patient verbalized understanding and stated she would call back to schedule appt.   Notes recorded by Theodoro Parma, RN on 11/12/2016 at 3:16 PM EDT Informed patient of results and verbal understanding expressed.  Repeat carotids ordered to be scheduled in 1 year. Patient agrees with treatment plan. ------

## 2017-11-07 ENCOUNTER — Other Ambulatory Visit: Payer: Self-pay | Admitting: Cardiology

## 2017-11-15 ENCOUNTER — Ambulatory Visit (HOSPITAL_COMMUNITY)
Admission: RE | Admit: 2017-11-15 | Discharge: 2017-11-15 | Disposition: A | Discharge status: Home / Self Care | Payer: Medicare Other | Source: Ambulatory Visit | Attending: Internal Medicine | Admitting: Internal Medicine

## 2017-11-15 DIAGNOSIS — E785 Hyperlipidemia, unspecified: Secondary | ICD-10-CM | POA: Insufficient documentation

## 2017-11-15 DIAGNOSIS — I779 Disorder of arteries and arterioles, unspecified: Secondary | ICD-10-CM | POA: Insufficient documentation

## 2017-11-15 DIAGNOSIS — I2510 Atherosclerotic heart disease of native coronary artery without angina pectoris: Secondary | ICD-10-CM | POA: Insufficient documentation

## 2017-11-15 DIAGNOSIS — Z853 Personal history of malignant neoplasm of breast: Secondary | ICD-10-CM | POA: Insufficient documentation

## 2017-11-15 DIAGNOSIS — I739 Peripheral vascular disease, unspecified: Secondary | ICD-10-CM

## 2017-11-15 DIAGNOSIS — E119 Type 2 diabetes mellitus without complications: Secondary | ICD-10-CM | POA: Insufficient documentation

## 2017-11-15 DIAGNOSIS — I10 Essential (primary) hypertension: Secondary | ICD-10-CM | POA: Insufficient documentation

## 2017-11-16 ENCOUNTER — Encounter: Payer: Self-pay | Admitting: Cardiology

## 2017-11-17 ENCOUNTER — Telehealth: Payer: Self-pay | Admitting: Cardiology

## 2017-11-17 DIAGNOSIS — I739 Peripheral vascular disease, unspecified: Principal | ICD-10-CM

## 2017-11-17 DIAGNOSIS — I779 Disorder of arteries and arterioles, unspecified: Secondary | ICD-10-CM

## 2017-11-17 NOTE — Telephone Encounter (Signed)
New message     Patient returning call for results for carotid

## 2017-11-17 NOTE — Telephone Encounter (Signed)
Notes recorded by Teressa Senter, RN on 11/17/2017 at 4:02 PM EDT Patient made aware of carotid US results. She verbalized understanding and thankful for the call. Carotid ordered to be scheduled in a year   Notes recorded by Sueanne Margarita, MD on 11/16/2017 at 1:22 PM EDT Bilateral mild carotid artery stenosis 1-39% - repeat study in 1 year

## 2018-01-05 IMAGING — CT CT CHEST W/O CM
2 of 4 series · 15 of 36 positions shown, 18 images · non-contrast
Comparison: None.

ADDENDUM:
These results were called by telephone on 09/18/2015 at [DATE] to Dr.
Samboni, who verbally acknowledged these results.
CLINICAL DATA: Right-sided chest pain and shortness breath for 1
week.

EXAM:
CT CHEST WITHOUT CONTRAST
TECHNIQUE: Multidetector CT imaging of the chest was performed following the
standard protocol without IV contrast.

[Series 4: chest w/o 1mm st · axial · non-contrast · 0.66mm/px · z∈[+897,+1149]mm · 12 of 347 slices shown, 15 images]
[im 16/347  mediastinal]
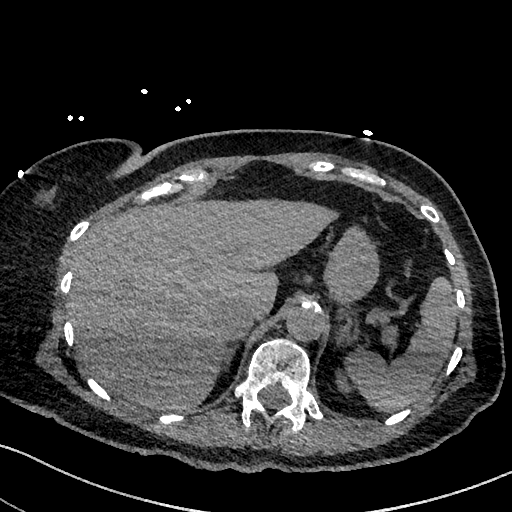
[im 16/347  lung]
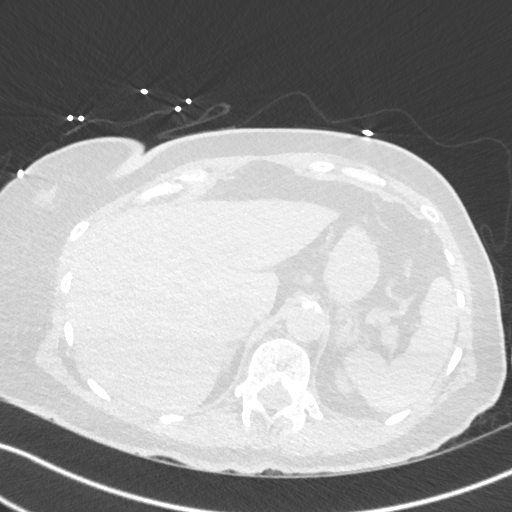
[im 46/347  lung]
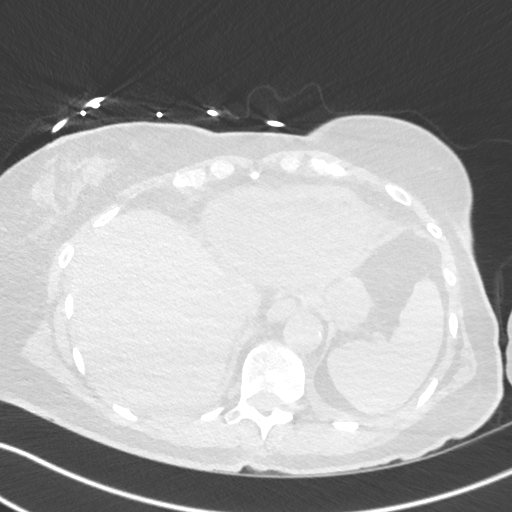
[im 76/347  lung]
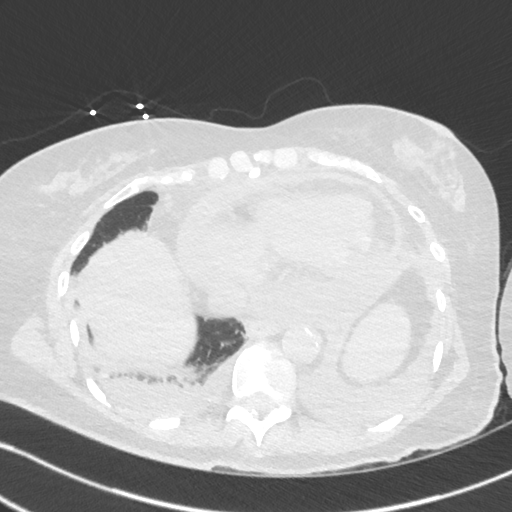
[im 106/347  lung]
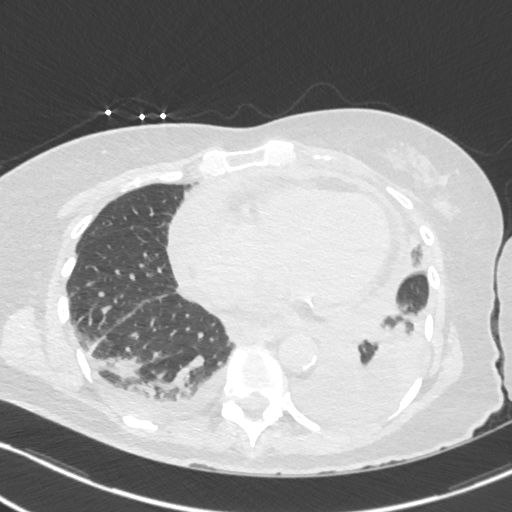
[im 136/347  mediastinal]
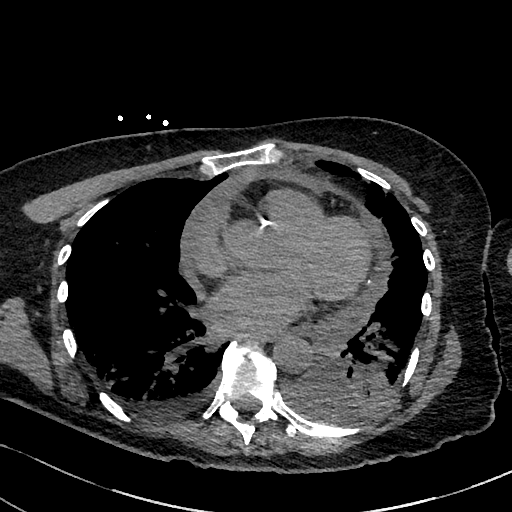
[im 136/347  lung]
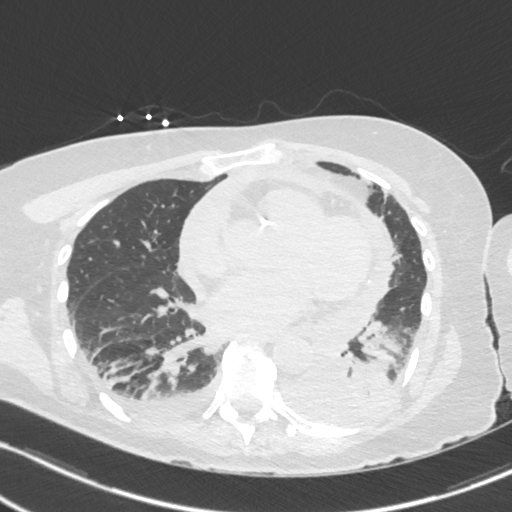
[im 166/347  lung]
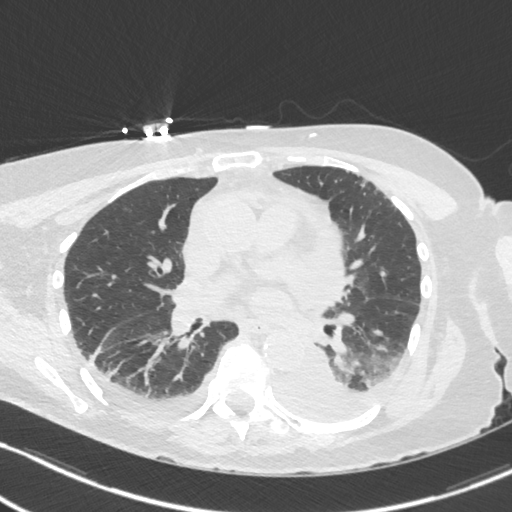
[im 181/347  lung]
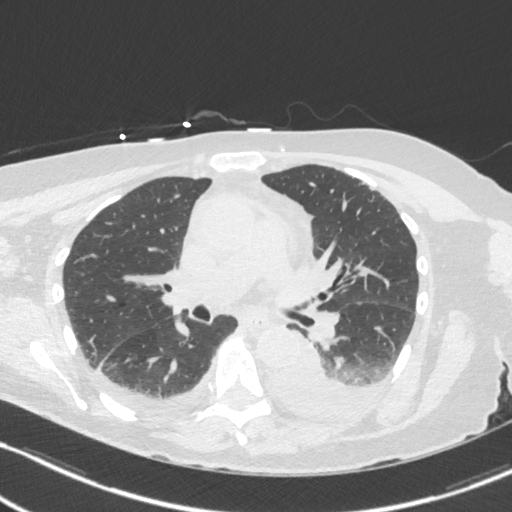
[im 211/347  lung]
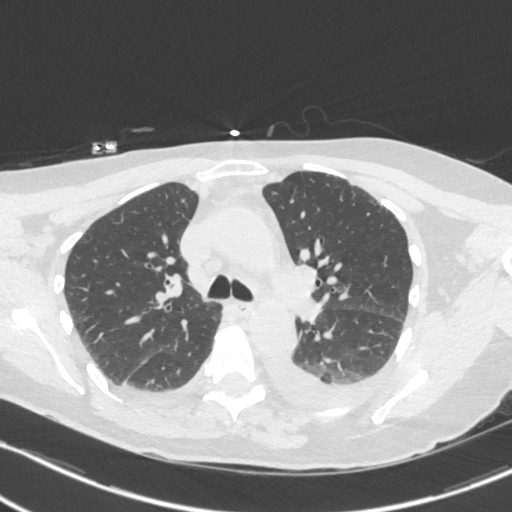
[im 241/347  mediastinal]
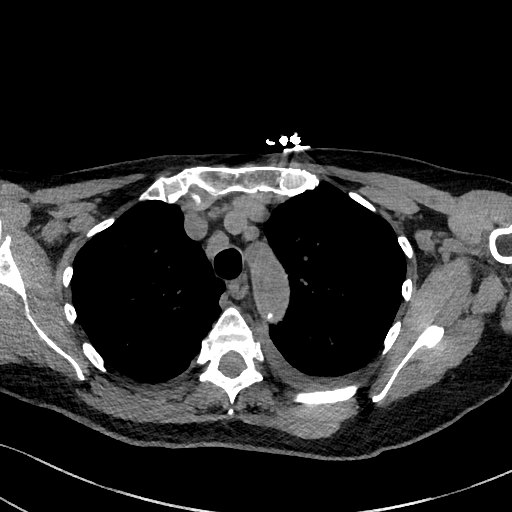
[im 241/347  lung]
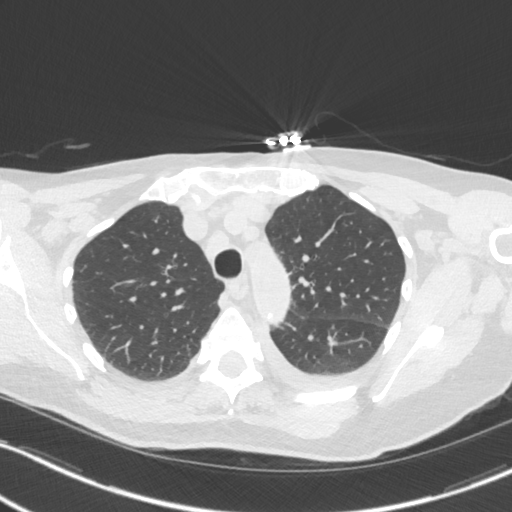
[im 271/347  lung]
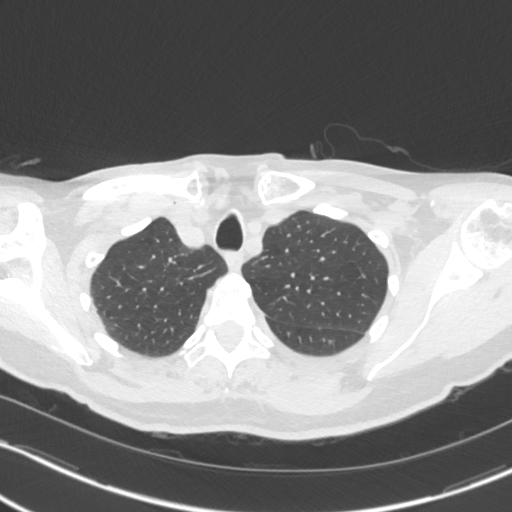
[im 301/347  lung]
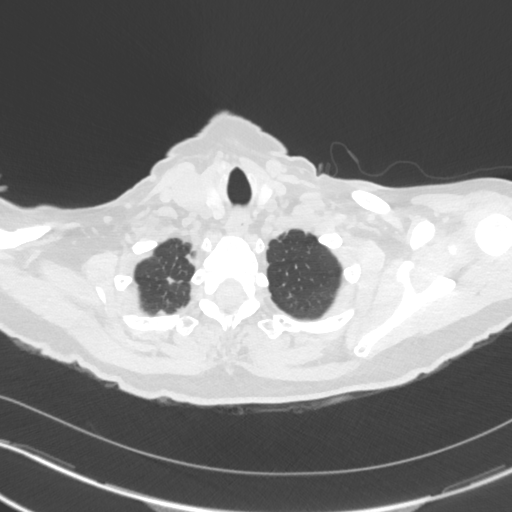
[im 331/347  lung]
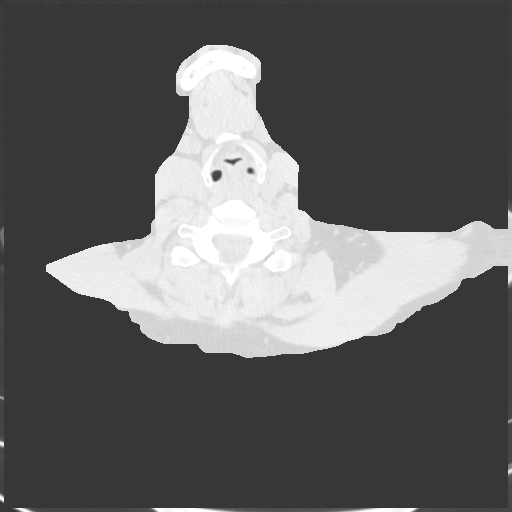

[Series 5: chest w/o 3mm st cor · coronal · non-contrast · 0.59mm/px · 3 of 75 slices shown]
[im 15/75  lung]
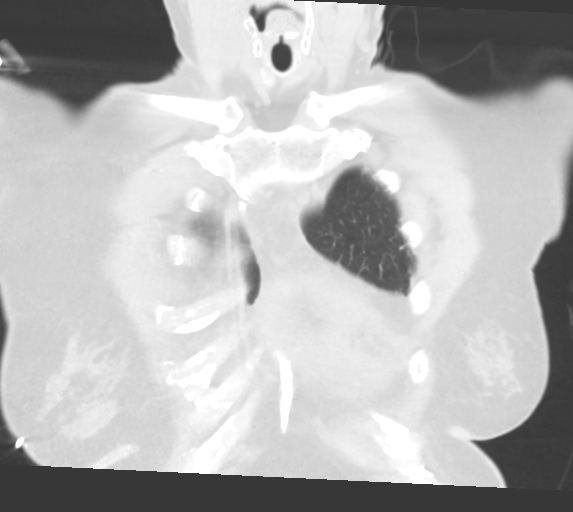
[im 30/75  lung]
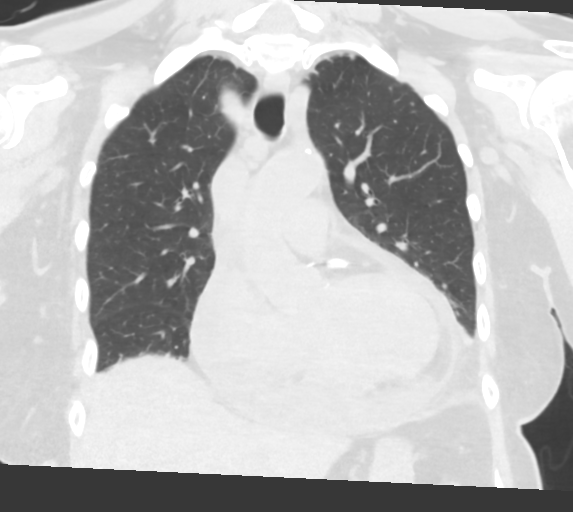
[im 45/75  lung]
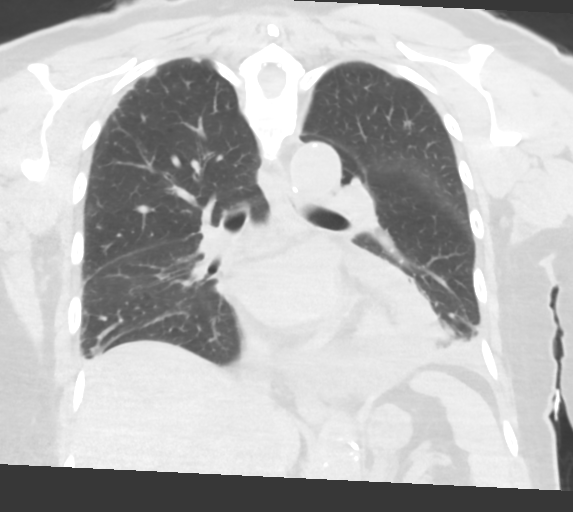

[15 of 36 positions shown; findings below may reference images not displayed]

FINDINGS: Mediastinum/Lymph Nodes: There is a moderate-sized pericardial
effusion, measuring 2 cm greatest thickness both anteriorly and
posteriorly. Overall, there is cardiomegaly which is mild to
moderate in degree. Appears to be a stent within the left anterior
descending coronary artery. Suspect additional coronary artery
calcifications within the more proximal LAD.

Scattered mild atherosclerotic calcifications noted along the walls
of the normal-caliber thoracic aorta. Scattered small lymph nodes
within the mediastinum. No masses or enlarged lymph nodes within the
mediastinum or perihilar regions.

Lungs/Pleura: Moderate-sized left pleural effusion with adjacent
compressive atelectasis. Smaller right pleural effusion with
adjacent atelectasis. Upper lungs are clear. Trachea and central
bronchi are unremarkable.

Upper abdomen: Limited images of the upper abdomen are unremarkable.

Musculoskeletal: No chest wall mass or suspicious bone lesions
identified. Mild degenerative change noted within the thoracic
spine.
IMPRESSION: 1. Pericardial effusion, moderate in size, measuring 2 cm greatest
thickness both anteriorly and posteriorly.
2. Cardiomegaly, mild to moderate in degree. Probable stent within
the left anterior descending coronary artery. Additional
atherosclerotic calcifications involving the more proximal LAD.
3. Bilateral pleural effusions, left greater than right, with
associated bibasilar atelectasis.

## 2018-01-24 ENCOUNTER — Telehealth: Payer: Self-pay | Admitting: Cardiology

## 2018-01-24 NOTE — Telephone Encounter (Signed)
Left detailed message informing pt that [name] may draw a BMET due to taking eliquis, but she will not need to fast prior to [place] since lipids as they were last drawn 12/18. Informed her to call back with any questions or concerns.

## 2018-01-24 NOTE — Telephone Encounter (Signed)
New message:      Pt is wanting to see if she is needing lab work done on the day of her appt on 01/27/18. Pt states to leave a detailed msg if she does not answer

## 2018-01-27 ENCOUNTER — Encounter: Payer: Self-pay | Admitting: Cardiology

## 2018-01-27 ENCOUNTER — Ambulatory Visit: Payer: Medicare Other | Admitting: Cardiology

## 2018-01-27 VITALS — BP 130/72 | HR 56 | Ht 65.0 in | Wt 163.0 lb

## 2018-01-27 DIAGNOSIS — E7800 Pure hypercholesterolemia, unspecified: Secondary | ICD-10-CM

## 2018-01-27 DIAGNOSIS — I10 Essential (primary) hypertension: Secondary | ICD-10-CM | POA: Diagnosis not present

## 2018-01-27 DIAGNOSIS — Z0389 Encounter for observation for other suspected diseases and conditions ruled out: Secondary | ICD-10-CM

## 2018-01-27 DIAGNOSIS — I2510 Atherosclerotic heart disease of native coronary artery without angina pectoris: Secondary | ICD-10-CM

## 2018-01-27 DIAGNOSIS — I4819 Other persistent atrial fibrillation: Secondary | ICD-10-CM

## 2018-01-27 DIAGNOSIS — I319 Disease of pericardium, unspecified: Secondary | ICD-10-CM

## 2018-01-27 DIAGNOSIS — I481 Persistent atrial fibrillation: Secondary | ICD-10-CM

## 2018-01-27 NOTE — Patient Instructions (Addendum)
Medication Instructions:  Your physician recommends that you continue on your current medications as directed. Please refer to the Current Medication list given to you today.   Labwork: TODAY:  BMET & CBC  Testing/Procedures: None ordered  Follow-Up: Your physician recommends that you schedule a follow-up appointment in: 07/25/2018 ARRIVE AT 12:45 TO SEE DR TURNER   Any Other Special Instructions Will Be Listed Below (If Applicable).     If you need a refill on your cardiac medications before your next appointment, please call your pharmacy.

## 2018-01-27 NOTE — Progress Notes (Signed)
Cardiology Office Note:    Date:  01/27/2018   ID:  Brandi Frey, DOB 1941/04/28, MRN [id number]  PCP:  [name], MD  Cardiologist:  No primary care provider on file.    Referring MD: Yong Channel*   No chief complaint on file.   History of Present Illness:    Brandi Frey is a 77 y.o. 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  She is here today for followup and is doing well.  She denies any chest pain or pressure, SOB, DOE, PND, orthopnea, LE edema, dizziness, palpitations or syncope. She is compliant with her meds and is tolerating meds with no SE.    Past Medical History:  Diagnosis Date  . Cancer of left breast ([place]) 08/2002  . CAP (community acquired pneumonia) 09/18/2015  . Carotid arterial disease (HCC)    1-39% bilaterally by dopplers 11/2017  . Coronary artery disease    a. 2009: DES x2 to LAD in [facility]   b. 07/2015: NSTEMI s/p DES to LAD.  . Diabetes mellitus, type 2 ([place])   . Hyperlipidemia   . Hypertension   . Mitral regurgitation   . Myocardial infarction ([place]) 08/07/2015   "very light one"  . Persistent atrial fibrillation ([place])    a. newly diagnosed 07/2015 in the setting of NSTEMI. Spont conv to NSR/SB. Started on Eliquis for CHADS2VASC score of 5  . SVT (supraventricular tachycardia) (HCC)     Past Surgical History:  Procedure Laterality Date  . ABDOMINAL HYSTERECTOMY  1998  . APPENDECTOMY  ~ 1960  . BREAST BIOPSY Left 08/2002   "needle biopsy"  . BREAST LUMPECTOMY Left 08/2002  . CARDIAC CATHETERIZATION N/A 08/07/2015   Procedure: Left Heart Cath and Coronary Angiography;  Surgeon: Burnell Blanks, MD;  Location: [place] [facility];  Service: Cardiovascular;  Laterality: N/A;  . CARDIAC CATHETERIZATION N/A 08/07/2015   Procedure: Coronary Stent Intervention;  Surgeon: Burnell Blanks, MD;  Location:  [place] [facility];  Service: Cardiovascular;  Laterality: N/A;  mid LAD  . CORONARY ANGIOPLASTY WITH STENT PLACEMENT  2009   LAD x 2 with DES  . DILATION AND CURETTAGE OF UTERUS  1980s X 1   "lots of bleeding & passing clots"  . TUBAL LIGATION  1970    Current Medications: Current Meds  Medication Sig  . atorvastatin (LIPITOR) 40 MG tablet Take 1 tablet (40 mg total) by mouth daily.  Marland Kitchen diltiazem (CARDIZEM) 60 MG tablet Take 1 tablet (60 mg total) 3 (three) times daily by mouth.  Arne Cleveland 5 MG TABS tablet TAKE 1 TABLET BY MOUTH TWO TIMES A DAY  . glipiZIDE (GLUCOTROL XL) 5 MG 24 hr tablet Take 1 tablet by mouth at bedtime.  Marland Kitchen latanoprost (XALATAN) 0.005 % ophthalmic solution Place 1 drop into both eyes at bedtime.  . metoprolol succinate (TOPROL-XL) 25 MG 24 hr tablet Take 0.5 tablets (12.5 mg total) by mouth every morning.     Allergies:   Latex; Tape; and Metformin   Social History   Socioeconomic History  . Marital status: Divorced    Spouse name: Not on file  . Number of children: Not on file  . Years of education: Not on file  . Highest education level: Not on file  Occupational History  . Not on file  Social Needs  . Financial resource strain:  Not on file  . Food insecurity:    Worry: Not on file    Inability: Not on file  . Transportation needs:    Medical: Not on file    Non-medical: Not on file  Tobacco Use  . Smoking status: Never Smoker  . Smokeless tobacco: Never Used  Substance and Sexual Activity  . Alcohol use: Yes    Comment: 09/18/2015 "might have a glass of wine a few times/year"  . Drug use: No  . Sexual activity: Not Currently  Lifestyle  . Physical activity:    Days per week: Not on file    Minutes per session: Not on file  . Stress: Not on file  Relationships  . Social connections:    Talks on phone: Not on file    Gets together: Not on file    Attends religious service: Not on file    Active member of club or organization: Not on file     Attends meetings of clubs or organizations: Not on file    Relationship status: Not on file  Other Topics Concern  . Not on file  Social History Narrative  . Not on file     Family History: The patient's family history includes CVA in her father; Diabetes in her brother; Heart attack in her brother; Heart disease in her brother; Hypertension in her sister; Ovarian cancer in her mother; Stroke in her father.  ROS:   Please see the history of present illness.    ROS  All other systems reviewed and negative.   EKGs/Labs/Other Studies Reviewed:    The following studies were reviewed today: none  EKG:  EKG is  ordered today.  The ekg ordered today demonstrates sinus bradycardia 56 bpm with PACs.  There is nonspecific ST-T wave abnormality.  Recent Labs: 08/02/2017: ALT 14; BUN 19; Creatinine, Ser 1.00; Hemoglobin 13.5; Platelets 238; Potassium 4.3; Sodium 141   Recent Lipid Panel    Component Value Date/Time   CHOL 155 08/02/2017 0832   TRIG 90 08/02/2017 0832   HDL 68 08/02/2017 0832   CHOLHDL 2.3 08/02/2017 0832   CHOLHDL 2.7 09/20/2015 0720   VLDL 11 09/20/2015 0720   LDLCALC 69 08/02/2017 0832    Physical Exam:    VS:  BP 130/72   Pulse (!) 56   Ht 5\' 5"  (1.651 m)   Wt 163 lb (73.9 kg)   BMI 27.12 kg/m     Wt Readings from Last 3 Encounters:  01/27/18 163 lb (73.9 kg)  08/02/17 160 lb (72.6 kg)  01/15/17 150 lb (68 kg)     GEN:  Well nourished, well developed in no acute distress HEENT: Normal NECK: No JVD; No carotid bruits LYMPHATICS: No lymphadenopathy CARDIAC: RRR, no murmurs, rubs, gallops RESPIRATORY:  Clear to auscultation without rales, wheezing or rhonchi  ABDOMEN: Soft, non-tender, non-distended MUSCULOSKELETAL:  No edema; No deformity  SKIN: Warm and dry NEUROLOGIC:  Alert and oriented x 3 PSYCHIATRIC:  Normal affect   ASSESSMENT:    1. Persistent atrial fibrillation ([place])   2. Essential hypertension   3. Coronary artery disease (CAD)  excluded   4. Coronary artery disease involving native coronary artery of native heart without angina pectoris   5. Chronic viral pericarditis, unspecified complication status   6. Pure hypercholesterolemia    PLAN:    In order of problems listed above:  1.  Persistent Atrial fibrillation -she is maintaining normal sinus rhythm on exam.  She will continue on Toprol-XL  12.5 mg daily and Cardizem 60 mg 3 times daily.  She will also continue Eliquis Eliquis 5 mg twice daily.  She has not had any excessive bleeding problems.  I will check a Bmet and CBC today.  2.  Hypertension -BP is well controlled on exam today.  He will continue on beta-blocker and calcium channel blocker therapy.  3.  CAD -  s/p DESx2 to LAD in 2009 and DES to LAD in 07/2015.  She denies any anginal symptoms.  She will continue on beta-blocker and statin.  She is not on aspirin due to Eliquis.  4.  History of pericarditis -she has not had any recurrence of chest pain.  5.  Hyperlipidemia -LDL goal is less than 70.  She will continue on Lipitor's 40 mg daily.  Her LDL was at goal at 69 on 08/02/2017 and ALT was normal at 14.   Medication Adjustments/Labs and Tests Ordered: Current medicines are reviewed at length with the patient today.  Concerns regarding medicines are outlined above.  Orders Placed This Encounter  Procedures  . EKG 12-Lead   No orders of the defined types were placed in this encounter.   Signed, Fransico Him, MD  01/27/2018 12:46 PM    [place]

## 2018-01-28 LAB — BASIC METABOLIC PANEL
BUN/Creatinine Ratio: 19 (ref 12–28)
BUN: 19 mg/dL (ref 8–27)
CALCIUM: 9.5 mg/dL (ref 8.7–10.3)
CHLORIDE: 104 mmol/L (ref 96–106)
CO2: 23 mmol/L (ref 20–29)
CREATININE: 0.99 mg/dL (ref 0.57–1.00)
GFR calc Af Amer: 65 mL/min/{1.73_m2} (ref >59)
GFR calc non Af Amer: 56 mL/min/{1.73_m2} — ABNORMAL LOW (ref >59)
GLUCOSE: 129 mg/dL — AB (ref 65–99)
Potassium: 4.5 mmol/L (ref 3.5–5.2)
Sodium: 142 mmol/L (ref 134–144)

## 2018-01-28 LAB — CBC
HEMATOCRIT: 38.6 % (ref 34.0–46.6)
HEMOGLOBIN: 12.8 g/dL (ref 11.1–15.9)
MCH: 28.2 pg (ref 26.6–33.0)
MCHC: 33.2 g/dL (ref 31.5–35.7)
MCV: 85 fL (ref 79–97)
Platelets: 240 10*3/uL (ref 150–450)
RBC: 4.54 x10E6/uL (ref 3.77–5.28)
RDW: 14.3 % (ref 12.3–15.4)
WBC: 9 10*3/uL (ref 3.4–10.8)

## 2018-04-10 ENCOUNTER — Other Ambulatory Visit: Payer: Self-pay | Admitting: Cardiology

## 2018-05-09 ENCOUNTER — Other Ambulatory Visit: Payer: Self-pay | Admitting: Cardiology

## 2018-05-10 ENCOUNTER — Other Ambulatory Visit: Payer: Self-pay | Admitting: *Deleted

## 2018-05-10 MED ORDER — APIXABAN 5 MG PO TABS: 5.0000 mg | ORAL_TABLET | Freq: Two times a day (BID) | ORAL | 10 refills | Status: DC

## 2018-06-19 ENCOUNTER — Other Ambulatory Visit: Payer: Self-pay | Admitting: Cardiology

## 2018-07-25 ENCOUNTER — Ambulatory Visit: Payer: Medicare Other | Admitting: Cardiology

## 2018-08-18 ENCOUNTER — Ambulatory Visit: Payer: Medicare Other | Admitting: Cardiology

## 2018-08-29 ENCOUNTER — Encounter (INDEPENDENT_AMBULATORY_CARE_PROVIDER_SITE_OTHER): Payer: Self-pay

## 2018-08-29 ENCOUNTER — Encounter: Payer: Self-pay | Admitting: Cardiology

## 2018-08-29 ENCOUNTER — Ambulatory Visit: Payer: Medicare Other | Admitting: Cardiology

## 2018-08-29 VITALS — BP 150/68 | HR 49 | Ht 65.0 in | Wt 164.8 lb

## 2018-08-29 DIAGNOSIS — I3139 Other pericardial effusion (noninflammatory): Secondary | ICD-10-CM

## 2018-08-29 DIAGNOSIS — I471 Supraventricular tachycardia: Secondary | ICD-10-CM

## 2018-08-29 DIAGNOSIS — I2510 Atherosclerotic heart disease of native coronary artery without angina pectoris: Secondary | ICD-10-CM

## 2018-08-29 DIAGNOSIS — I10 Essential (primary) hypertension: Secondary | ICD-10-CM

## 2018-08-29 DIAGNOSIS — I6523 Occlusion and stenosis of bilateral carotid arteries: Secondary | ICD-10-CM

## 2018-08-29 DIAGNOSIS — I313 Pericardial effusion (noninflammatory): Secondary | ICD-10-CM

## 2018-08-29 DIAGNOSIS — E7800 Pure hypercholesterolemia, unspecified: Secondary | ICD-10-CM

## 2018-08-29 DIAGNOSIS — I4819 Other persistent atrial fibrillation: Secondary | ICD-10-CM

## 2018-08-29 NOTE — Patient Instructions (Signed)
Medication Instructions:  Your physician recommends that you continue on your current medications as directed. Please refer to the Current Medication list given to you today.  If you need a refill on your cardiac medications before your next appointment, please call your pharmacy.   Lab work: Today: CBC, LFT and Lipid  If you have labs (blood work) drawn today and your tests are completely normal, you will receive your results only by: Marland Kitchen MyChart Message (if you have MyChart) OR . A paper copy in the mail If you have any lab test that is abnormal or we need to change your treatment, we will call you to review the results.  Testing/Procedures: None  Follow-Up: At [facility], you and your health needs are our priority.  As part of our continuing mission to provide you with exceptional heart care, we have created designated Provider Care Teams.  These Care Teams include your primary Cardiologist (physician) and Advanced Practice Providers (APPs -  Physician Assistants and Nurse Practitioners) who all work together to provide you with the care you need, when you need it.  Your physician recommends that you schedule a follow-up appointment in: 6 months with the PA.    You will need a follow up appointment in 1 years.  Please call our office 2 months in advance to schedule this appointment.  You may see [name] or one of the following Advanced Practice Providers on your designated Care Team:   [place], PA-C Melina Copa, PA-C . Ermalinda Barrios, PA-C

## 2018-08-29 NOTE — Progress Notes (Signed)
Cardiology Office Note:    Date:  08/29/2018   ID:  Brandi Frey, DOB 1941/05/03, MRN [id number]  PCP:  [name], MD  Cardiologist:  No primary care provider on file.    Referring MD: Yong Channel*   Chief Complaint  Patient presents with  . Coronary Artery Disease  . Hyperlipidemia  . Hypertension    History of Present Illness:    Brandi Frey is a 78 y.o. 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  She is here today for followup and is doing well.  She denies any chest pain or pressure, SOB, DOE, PND, orthopnea, LE edema, dizziness, palpitations or syncope. She is compliant with her meds and is tolerating meds with no SE.    Past Medical History:  Diagnosis Date  . Cancer of left breast ([place]) 08/2002  . CAP (community acquired pneumonia) 09/18/2015  . Carotid arterial disease (HCC)    1-39% bilaterally by dopplers 11/2017  . Coronary artery disease    a. 2009: DES x2 to LAD in [facility]   b. 07/2015: NSTEMI s/p DES to LAD.  . Diabetes mellitus, type 2 ([place])   . Hyperlipidemia   . Hypertension   . Mitral regurgitation    Mild by echo 2018  . Myocardial infarction ([place]) 08/07/2015   "very light one"  . Persistent atrial fibrillation    a. newly diagnosed 07/2015 in the setting of NSTEMI. Spont conv to NSR/SB. Started on Eliquis for CHADS2VASC score of 5  . SVT (supraventricular tachycardia) (HCC)     Past Surgical History:  Procedure Laterality Date  . ABDOMINAL HYSTERECTOMY  1998  . APPENDECTOMY  ~ 1960  . BREAST BIOPSY Left 08/2002   "needle biopsy"  . BREAST LUMPECTOMY Left 08/2002  . CARDIAC CATHETERIZATION N/A 08/07/2015   Procedure: Left Heart Cath and Coronary Angiography;  Surgeon: Burnell Blanks, MD;  Location: [place] [facility];  Service: Cardiovascular;  Laterality: N/A;  . CARDIAC CATHETERIZATION N/A 08/07/2015    Procedure: Coronary Stent Intervention;  Surgeon: Burnell Blanks, MD;  Location: [place] [facility];  Service: Cardiovascular;  Laterality: N/A;  mid LAD  . CORONARY ANGIOPLASTY WITH STENT PLACEMENT  2009   LAD x 2 with DES  . DILATION AND CURETTAGE OF UTERUS  1980s X 1   "lots of bleeding & passing clots"  . TUBAL LIGATION  1970    Current Medications: No outpatient medications have been marked as taking for the 08/29/18 encounter (Office Visit) with Sueanne Margarita, MD.     Allergies:   Latex; Tape; and Metformin   Social History   Socioeconomic History  . Marital status: Divorced    Spouse name: Not on file  . Number of children: Not on file  . Years of education: Not on file  . Highest education level: Not on file  Occupational History  . Not on file  Social Needs  . Financial resource strain: Not on file  . Food insecurity:    Worry: Not on file    Inability: Not on file  . Transportation needs:    Medical: Not on file    Non-medical: Not on file  Tobacco Use  . Smoking status: Never Smoker  . Smokeless tobacco: Never Used  Substance and Sexual Activity  . Alcohol use: Yes    Comment: 09/18/2015 "might have a  glass of wine a few times/year"  . Drug use: No  . Sexual activity: Not Currently  Lifestyle  . Physical activity:    Days per week: Not on file    Minutes per session: Not on file  . Stress: Not on file  Relationships  . Social connections:    Talks on phone: Not on file    Gets together: Not on file    Attends religious service: Not on file    Active member of club or organization: Not on file    Attends meetings of clubs or organizations: Not on file    Relationship status: Not on file  Other Topics Concern  . Not on file  Social History Narrative  . Not on file     Family History: The patient's family history includes CVA in her father; Diabetes in her brother; Heart attack in her brother; Heart disease in her brother; Hypertension in  her sister; Ovarian cancer in her mother; Stroke in her father.  ROS:   Please see the history of present illness.    ROS  All other systems reviewed and negative.   EKGs/Labs/Other Studies Reviewed:    The following studies were reviewed today: none  EKG:  EKG is not ordered today.    Recent Labs: 01/27/2018: BUN 19; Creatinine, Ser 0.99; Hemoglobin 12.8; Platelets 240; Potassium 4.5; Sodium 142   Recent Lipid Panel    Component Value Date/Time   CHOL 155 08/02/2017 0832   TRIG 90 08/02/2017 0832   HDL 68 08/02/2017 0832   CHOLHDL 2.3 08/02/2017 0832   CHOLHDL 2.7 09/20/2015 0720   VLDL 11 09/20/2015 0720   LDLCALC 69 08/02/2017 0832    Physical Exam:    VS:  There were no vitals taken for this visit.    Wt Readings from Last 3 Encounters:  01/27/18 163 lb (73.9 kg)  08/02/17 160 lb (72.6 kg)  01/15/17 150 lb (68 kg)     GEN:  Well nourished, well developed in no acute distress HEENT: Normal NECK: No JVD; No carotid bruits LYMPHATICS: No lymphadenopathy CARDIAC: RRR, no murmurs, rubs, gallops RESPIRATORY:  Clear to auscultation without rales, wheezing or rhonchi  ABDOMEN: Soft, non-tender, non-distended MUSCULOSKELETAL:  No edema; No deformity  SKIN: Warm and dry NEUROLOGIC:  Alert and oriented x 3 PSYCHIATRIC:  Normal affect   ASSESSMENT:    1. Coronary artery disease involving native coronary artery of native heart without angina pectoris   2. Persistent atrial fibrillation   3. Essential hypertension   4. SVT (supraventricular tachycardia) ([place])   5. Pericardial effusion   6. Pure hypercholesterolemia   7. Bilateral carotid artery stenosis    PLAN:    In order of problems listed above:  1.  ASCAD - s/p DESx2 to LAD in 2009 and DES to LAD in 07/2015.  He denies any anginal symptoms.  She will continue on beta-blocker and statin therapy.  She is not on aspirin due to Eliquis.  2.  Persistent atrial fibrillation -she is maintaining normal sinus  rhythm on exam.  She will continue on apixaban 5 mg twice daily, Cardizem 60 mg 3 times daily and Toprol-XL 12.5 mg daily.  Her creatinine was stable at 0.82 and potassium 4.2 on 05/05/2018.  I will check a hemoglobin since that is not been done in some time.  Denies any significant bleeding problems.  3.  HTN -he is well controlled on exam today.  She will continue on Toprol-XL 12.5 mg daily,  Cardizem 60 mg 3 times daily.  4.  SVT -she has not had any recurrence of arrhythmias or palpitations.  She will continue on Cardizem 60 mg 3 times daily and Toprol-XL 12.5 mg daily.  5.  H/O pericardial effusion -remote history of pleuropericarditis in 2016.  Her last echo 11/2016 showed no evidence of cardial effusion.  6.  Hyperlipidemia -LDL goal is less than 70.  LDL done on 08/02/2017.  I will repeat an FLP and ALT today.  She will continue on atorvastatin 40 mg daily.  7.  Bilateral carotid artery stenosis -Dopplers 11/2017 showed 1 to 39% stenosis bilaterally.Continue aggressive risk factor modification.  Continue on statin therapy.   Medication Adjustments/Labs and Tests Ordered: Current medicines are reviewed at length with the patient today.  Concerns regarding medicines are outlined above.  No orders of the defined types were placed in this encounter.  No orders of the defined types were placed in this encounter.   Signed, Fransico Him, MD  08/29/2018 9:24 AM    [place]

## 2018-08-30 ENCOUNTER — Telehealth: Payer: Self-pay | Admitting: *Deleted

## 2018-08-30 DIAGNOSIS — E7800 Pure hypercholesterolemia, unspecified: Secondary | ICD-10-CM

## 2018-08-30 LAB — CBC
HEMATOCRIT: 38.4 % (ref 34.0–46.6)
HEMOGLOBIN: 13 g/dL (ref 11.1–15.9)
MCH: 28.1 pg (ref 26.6–33.0)
MCHC: 33.9 g/dL (ref 31.5–35.7)
MCV: 83 fL (ref 79–97)
Platelets: 243 10*3/uL (ref 150–450)
RBC: 4.63 x10E6/uL (ref 3.77–5.28)
RDW: 13.1 % (ref 11.7–15.4)
WBC: 9.1 10*3/uL (ref 3.4–10.8)

## 2018-08-30 LAB — HEPATIC FUNCTION PANEL
ALK PHOS: 96 IU/L (ref 39–117)
ALT: 13 IU/L (ref 0–32)
AST: 15 IU/L (ref 0–40)
Albumin: 4.2 g/dL (ref 3.7–4.7)
BILIRUBIN, DIRECT: 0.15 mg/dL (ref 0.00–0.40)
Bilirubin Total: 0.5 mg/dL (ref 0.0–1.2)
Total Protein: 6.9 g/dL (ref 6.0–8.5)

## 2018-08-30 LAB — LIPID PANEL
CHOLESTEROL TOTAL: 159 mg/dL (ref 100–199)
Chol/HDL Ratio: 2.4 ratio (ref 0.0–4.4)
HDL: 65 mg/dL (ref >39)
LDL Calculated: 76 mg/dL (ref 0–99)
TRIGLYCERIDES: 92 mg/dL (ref 0–149)
VLDL Cholesterol Cal: 18 mg/dL (ref 5–40)

## 2018-08-30 NOTE — Telephone Encounter (Signed)
-----   Message from Sueanne Margarita, MD sent at 08/29/2018  7:07 PM EST ----- LDL close to goal - still a little too high - please have patient focus on low fat diet more and repeat FLP in n4 months

## 2018-08-30 NOTE — Telephone Encounter (Signed)
Patient notified of result.  Please refer to phone note from today for complete details.   Julaine Hua, [place] 08/30/2018 11:07 AM   Pt advised to limit fats in her diet. Pt is agreeable to FLP to be done in 4 months 12/26/18. Pt thanked me for the call.

## 2018-10-18 ENCOUNTER — Other Ambulatory Visit: Payer: Self-pay | Admitting: Cardiology

## 2018-10-27 ENCOUNTER — Other Ambulatory Visit: Payer: Self-pay | Admitting: Cardiology

## 2018-10-27 DIAGNOSIS — I6523 Occlusion and stenosis of bilateral carotid arteries: Secondary | ICD-10-CM

## 2018-11-16 ENCOUNTER — Encounter (HOSPITAL_COMMUNITY): Payer: Medicare HMO

## 2018-12-21 ENCOUNTER — Encounter (HOSPITAL_COMMUNITY): Payer: Medicare HMO

## 2018-12-26 ENCOUNTER — Other Ambulatory Visit: Payer: Managed Care, Other (non HMO)

## 2019-01-03 ENCOUNTER — Other Ambulatory Visit: Payer: Self-pay

## 2019-01-03 ENCOUNTER — Encounter (INDEPENDENT_AMBULATORY_CARE_PROVIDER_SITE_OTHER): Payer: Self-pay

## 2019-01-03 ENCOUNTER — Other Ambulatory Visit: Payer: Medicare HMO

## 2019-01-03 DIAGNOSIS — E7800 Pure hypercholesterolemia, unspecified: Secondary | ICD-10-CM

## 2019-01-03 LAB — LIPID PANEL
Chol/HDL Ratio: 2.5 ratio (ref 0.0–4.4)
Cholesterol, Total: 135 mg/dL (ref 100–199)
HDL: 53 mg/dL (ref >39)
LDL Calculated: 64 mg/dL (ref 0–99)
Triglycerides: 88 mg/dL (ref 0–149)
VLDL Cholesterol Cal: 18 mg/dL (ref 5–40)

## 2019-01-31 ENCOUNTER — Other Ambulatory Visit: Payer: Self-pay | Admitting: Cardiology

## 2019-02-13 ENCOUNTER — Other Ambulatory Visit (HOSPITAL_COMMUNITY): Payer: Self-pay | Admitting: Cardiology

## 2019-02-13 DIAGNOSIS — I6523 Occlusion and stenosis of bilateral carotid arteries: Secondary | ICD-10-CM

## 2019-02-22 ENCOUNTER — Ambulatory Visit (HOSPITAL_COMMUNITY)
Admission: RE | Admit: 2019-02-22 | Discharge: 2019-02-22 | Disposition: A | Discharge status: Home / Self Care | Payer: Medicare HMO | Source: Ambulatory Visit | Attending: Cardiovascular Disease | Admitting: Cardiovascular Disease

## 2019-02-22 ENCOUNTER — Other Ambulatory Visit: Payer: Self-pay

## 2019-02-22 DIAGNOSIS — I6523 Occlusion and stenosis of bilateral carotid arteries: Secondary | ICD-10-CM | POA: Insufficient documentation

## 2019-02-27 ENCOUNTER — Telehealth: Payer: Self-pay | Admitting: Physician Assistant

## 2019-02-27 DIAGNOSIS — I480 Paroxysmal atrial fibrillation: Secondary | ICD-10-CM | POA: Insufficient documentation

## 2019-02-27 NOTE — Progress Notes (Signed)
Virtual Visit via Telephone Note   This visit type was conducted due to national recommendations for restrictions regarding the COVID-19 Pandemic (e.g. social distancing) in an effort to limit this patient's exposure and mitigate transmission in our community.  Due to her co-morbid illnesses, this patient is at least at moderate risk for complications without adequate follow up.  This format is felt to be most appropriate for this patient at this time.  The patient did not have access to video technology/had technical difficulties with video requiring transitioning to audio format only (telephone).  All issues noted in this document were discussed and addressed.  No physical exam could be performed with this format.  Please refer to the patient's chart for her  consent to telehealth for [facility].   Date:  02/28/2019   ID:  Brandi Frey, DOB 07-15-1941, MRN [id number]  Patient Location: Home Provider Location: Home  PCP:  [name], MD  Cardiologist:  Fransico Him, MD  Electrophysiologist:  None   Evaluation Performed:  Follow-Up Visit  Chief Complaint:  Follow  History of Present Illness:    Brandi Frey is a 78 y.o. 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  Last echo 2018 normal LVEF 55 to 60% with grade 2 DD Patient last saw [name] 08/2018 and doing well.  Was maintaining normal sinus rhythm.  Carotid Dopplers 02/2019 1 to 39% bilateral.  Denies chest pain, palpitations, dyspnea on exertion, edema dizziness or presyncope. 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    The patient does not have symptoms concerning for COVID-19 infection (fever, chills, cough, or new shortness of breath).    Past Medical History:  Diagnosis Date  . Cancer of left breast ([place]) 08/2002  . CAP (community acquired  pneumonia) 09/18/2015  . Carotid arterial disease (HCC)    1-39% bilaterally by dopplers 11/2017  . Coronary artery disease    a. 2009: DES x2 to LAD in [facility]   b. 07/2015: NSTEMI s/p DES to LAD.  . Diabetes mellitus, type 2 ([place])   . Hyperlipidemia   . Hypertension   . Mitral regurgitation    Mild by echo 2018  . Myocardial infarction ([place]) 08/07/2015   "very light one"  . Persistent atrial fibrillation    a. newly diagnosed 07/2015 in the setting of NSTEMI. Spont conv to NSR/SB. Started on Eliquis for CHADS2VASC score of 5  . SVT (supraventricular tachycardia) (HCC)    Past Surgical History:  Procedure Laterality Date  . ABDOMINAL HYSTERECTOMY  1998  . APPENDECTOMY  ~ 1960  . BREAST BIOPSY Left 08/2002   "needle biopsy"  . BREAST LUMPECTOMY Left 08/2002  . CARDIAC CATHETERIZATION N/A 08/07/2015   Procedure: Left Heart Cath and Coronary Angiography;  Surgeon: Burnell Blanks, MD;  Location: [place] CV LAB;  Service: Cardiovascular;  Laterality: N/A;  . CARDIAC CATHETERIZATION N/A 08/07/2015   Procedure: Coronary Stent Intervention;  Surgeon: Burnell Blanks, MD;  Location: [place] CV LAB;  Service: Cardiovascular;  Laterality: N/A;  mid LAD  . CORONARY ANGIOPLASTY WITH STENT PLACEMENT  2009   LAD x 2 with DES  . DILATION AND CURETTAGE OF UTERUS  1980s X 1   "lots of bleeding & passing clots"  . TUBAL LIGATION  1970     Current Meds  Medication Sig  .  apixaban (ELIQUIS) 5 MG TABS tablet Take 1 tablet (5 mg total) by mouth 2 (two) times daily.  Marland Kitchen atorvastatin (LIPITOR) 40 MG tablet TAKE 1 TABLET BY MOUTH EVERY DAY  . diltiazem (CARDIZEM) 60 MG tablet TAKE 1 TABLET BY MOUTH THREE TIMES A DAY  . glipiZIDE (GLUCOTROL XL) 5 MG 24 hr tablet Take 1 tablet by mouth at bedtime.  Marland Kitchen latanoprost (XALATAN) 0.005 % ophthalmic solution Place 1 drop into both eyes at bedtime.  . metoprolol succinate (TOPROL-XL) 25 MG 24 hr tablet TAKE 1/2 TABLET BY MOUTH EVERY DAY IN THE  MORNING     Allergies:   Latex, Tape, and Metformin   Social History   Tobacco Use  . Smoking status: Never Smoker  . Smokeless tobacco: Never Used  Substance Use Topics  . Alcohol use: Yes    Comment: 09/18/2015 "might have a glass of wine a few times/year"  . Drug use: No     Family Hx: The patient's family history includes CVA in her father; Diabetes in her brother; Heart attack in her brother; Heart disease in her brother; Hypertension in her sister; Ovarian cancer in her mother; Stroke in her father.  ROS:   Please see the history of present illness.    Review of Systems  Constitution: Negative.  HENT: Negative.   Eyes: Negative.   Cardiovascular: Negative.   Respiratory: Negative.   Hematologic/Lymphatic: Negative.   Musculoskeletal: Negative.  Negative for joint pain.  Gastrointestinal: Negative.   Genitourinary: Negative.   Neurological: Negative.     All other systems reviewed and are negative.   Prior CV studies:   The following studies were reviewed today:  Carotid Doppler 7/15/2020Summary: Right Carotid: Velocities in the right ICA are consistent with a 1-39% stenosis.   Left Carotid: Velocities in the left ICA are consistent with a 1-39% stenosis.   Vertebrals:  Bilateral vertebral arteries demonstrate antegrade flow. Subclavians: Normal flow hemodynamics were seen in bilateral subclavian              arteries.   *See table(s) above for measurements and observations.       Electronically signed by Larae Grooms MD on 02/23/2019 at 1:13:45 PM.     Cardiac cath 2016Prox RCA lesion, 20% stenosed.  Mid RCA lesion, 40% stenosed.  2nd Mrg lesion, 20% stenosed.  Ost 1st Mrg lesion, 40% stenosed.  Patent stent proximal LAD with diffuse 40-50% stent restenosis.  Mid LAD with 99% stenosis beyond the diagonal branch. This is just proximal to the previously placed stent. The stent has 60% restenosis on the proximal edge. Following PCI the stenosis  is 0%.  The left ventricular systolic function is normal.   Recommendations: Will continue Plavix and ASA tonight. If there are plans for long term anti-coagulation, would continue Plavix along with the anti-coagulant of choice by the rounding team. I would stop ASA after anti-coagulant is started. Will restart IV heparin 8 hours post sheath pull tonight. Continue beta blocker and statin.     2D echo 2018Study Conclusions   - Left ventricle: The cavity size was normal. Wall thickness was   normal. Systolic function was normal. The estimated ejection   fraction was in the range of 55% to 60%. Wall motion was normal;   there were no regional wall motion abnormalities. Doppler   parameters are consistent with pseudonormal left ventricular   relaxation (grade 2 diastolic dysfunction). The E/e&' ratio is   between 8-15, suggesting indeterminate LV filling pressure. - Mitral  valve: Calcified annulus. Mildly thickened leaflets .   There was mild regurgitation. - Left atrium: The atrium was normal in size. - Right atrium: The atrium was mildly dilated. - Tricuspid valve: There was mild regurgitation. - Pulmonary arteries: PA peak pressure: 26 mm Hg (S). - Inferior vena cava: The vessel was normal in size. The   respirophasic diameter changes were in the normal range (= 50%),   consistent with normal central venous pressure.   Impressions:   - Compared to a prior study in 2017, there are no significant   changes.   Labs/Other Tests and Data Reviewed:    EKG:  No ECG reviewed.  Recent Labs: 08/29/2018: ALT 13; Hemoglobin 13.0; Platelets 243   Recent Lipid Panel Lab Results  Component Value Date/Time   CHOL 135 01/03/2019 08:27 AM   TRIG 88 01/03/2019 08:27 AM   HDL 53 01/03/2019 08:27 AM   CHOLHDL 2.5 01/03/2019 08:27 AM   CHOLHDL 2.7 09/20/2015 07:20 AM   LDLCALC 64 01/03/2019 08:27 AM    Wt Readings from Last 3 Encounters:  02/28/19 160 lb (72.6 kg)  08/29/18 164 lb 12.8 oz  (74.8 kg)  01/27/18 163 lb (73.9 kg)     Objective:    Vital Signs:  BP 111/75   Pulse (!) 49   Ht 5\' 5"  (1.651 m)   Wt 160 lb (72.6 kg)   BMI 26.63 kg/m    Vitals reviewed   ASSESSMENT & PLAN:     CAD status post DES to the LAD x2 in 2009, DES to the LAD 07/2015-no angina  PAF on Eliquis no bleeding problems. Will refill. PCP to draw bmet.  Essential hypertension BP well controlled.  Hyperlipidemia LDL 64 12/2018   COVID-19 Education: The signs and symptoms of COVID-19 were discussed with the patient and how to seek care for testing (follow up with PCP or arrange E-visit).   The importance of social distancing was discussed today.  Time:   Today, I have spent 8:10 minutes with the patient with telehealth technology discussing the above problems.     Medication Adjustments/Labs and Tests Ordered: Current medicines are reviewed at length with the patient today.  Concerns regarding medicines are outlined above.   Tests Ordered: No orders of the defined types were placed in this encounter.   Medication Changes: No orders of the defined types were placed in this encounter.   Follow Up:  Virtual Visit in 6 month(s) [name] patient lives in [place], [place] 1/2 hrs away.  Signed, Ermalinda Barrios, PA-C  02/28/2019 9:24 AM    [facility]

## 2019-02-27 NOTE — Telephone Encounter (Signed)

## 2019-02-28 ENCOUNTER — Other Ambulatory Visit: Payer: Self-pay

## 2019-02-28 ENCOUNTER — Encounter: Payer: Self-pay | Admitting: Physician Assistant

## 2019-02-28 ENCOUNTER — Other Ambulatory Visit: Payer: Self-pay | Admitting: *Deleted

## 2019-02-28 ENCOUNTER — Telehealth (INDEPENDENT_AMBULATORY_CARE_PROVIDER_SITE_OTHER): Payer: Medicare HMO | Admitting: Physician Assistant

## 2019-02-28 VITALS — BP 111/75 | HR 49 | Ht 65.0 in | Wt 160.0 lb

## 2019-02-28 DIAGNOSIS — I10 Essential (primary) hypertension: Secondary | ICD-10-CM | POA: Diagnosis not present

## 2019-02-28 DIAGNOSIS — I2510 Atherosclerotic heart disease of native coronary artery without angina pectoris: Secondary | ICD-10-CM | POA: Diagnosis not present

## 2019-02-28 DIAGNOSIS — E7800 Pure hypercholesterolemia, unspecified: Secondary | ICD-10-CM

## 2019-02-28 DIAGNOSIS — I480 Paroxysmal atrial fibrillation: Secondary | ICD-10-CM

## 2019-02-28 NOTE — Patient Instructions (Signed)
Medication Instructions:  Your physician recommends that you continue on your current medications as directed. Please refer to the Current Medication list given to you today.  If you need a refill on your cardiac medications before your next appointment, please call your pharmacy.   Lab work: None ordered  If you have labs (blood work) drawn today and your tests are completely normal, you will receive your results only by: . MyChart Message (if you have MyChart) OR . A paper copy in the mail If you have any lab test that is abnormal or we need to change your treatment, we will call you to review the results.  Testing/Procedures: None ordered  Follow-Up: At [facility], you and your health needs are our priority.  As part of our continuing mission to provide you with exceptional heart care, we have created designated Provider Care Teams.  These Care Teams include your primary Cardiologist (physician) and Advanced Practice Providers (APPs -  Physician Assistants and Nurse Practitioners) who all work together to provide you with the care you need, when you need it. You will need a follow up appointment in 12 months.  Please call our office 2 months in advance to schedule this appointment.  You may see Traci Turner, MD or one of the following Advanced Practice Providers on your designated Care Team:   Brittainy Simmons, PA-C Dayna Dunn, PA-C . Michele Lenze, PA-C  Any Other Special Instructions Will Be Listed Below (If Applicable).    

## 2019-02-28 NOTE — Telephone Encounter (Addendum)
Pt last saw Ermalinda Barrios, PA on 02/28/19 telemedicine visit Covid-19, last labs 02/17/18 Creat 0.88 [place] [facility], pt is overdue for labwork. Age 78, weight 74.8kg, based on specified criteria pt is on appropriate dosage of Eliquis 5mg  BID.  Pt needs labwork repeated, attempted to contact pt [facility] TCB needs labwork, also placed note on recall appt for 07/2019 for Dr Radford Pax.

## 2019-03-06 MED ORDER — APIXABAN 5 MG PO TABS: 5.0000 mg | ORAL_TABLET | Freq: Two times a day (BID) | ORAL | 6 refills | Status: DC

## 2019-03-06 NOTE — Telephone Encounter (Signed)
Pt saw PCP at [facility] on 03/02/19 labwork done Creat 0.83, age 78, weight 82.6kg, based on specified criteria pt is on appropriate dosage of Eliquis 5mg  BID.  Will refill rx.

## 2019-03-07 ENCOUNTER — Telehealth: Payer: Self-pay | Admitting: *Deleted

## 2019-03-07 DIAGNOSIS — E7800 Pure hypercholesterolemia, unspecified: Secondary | ICD-10-CM

## 2019-03-07 DIAGNOSIS — I2510 Atherosclerotic heart disease of native coronary artery without angina pectoris: Secondary | ICD-10-CM

## 2019-03-07 MED ORDER — ATORVASTATIN CALCIUM 80 MG PO TABS
80.0000 mg | ORAL_TABLET | Freq: Every day | ORAL | 3 refills | Status: DC
Start: 2019-03-07 — End: 2019-04-18

## 2019-03-07 NOTE — Telephone Encounter (Signed)
-----   Message from Nuala Alpha, LPN sent at 4/70/7615  3:18 PM EDT -----  ----- Message ----- From: Sueanne Margarita, MD Sent: 03/07/2019  10:14 AM EDT To: Cv [facility] Triage  LDL on recent labs was 84 and goal is < 70.  Please increase lipitor to 80mg  daily and repeat FLP and ALT in 6 weeks

## 2019-03-07 NOTE — Telephone Encounter (Signed)
Pt has been notified of lab results by phone with verbal understanding. Pt agreeable to increase Lipitor to 80 mg daily, she will double up on her 40 mg tablets and call when ready for the new Rx to be sent in, med list has been updated. FLP/ALT 04/21/19. Pt thanked me for the call. Patient notified of result.  Please refer to phone note from today for complete details.   Julaine Hua, [place] 03/07/2019 3:50 PM

## 2019-04-18 ENCOUNTER — Other Ambulatory Visit: Payer: Self-pay | Admitting: Cardiology

## 2019-04-18 DIAGNOSIS — I2510 Atherosclerotic heart disease of native coronary artery without angina pectoris: Secondary | ICD-10-CM

## 2019-04-18 DIAGNOSIS — E7800 Pure hypercholesterolemia, unspecified: Secondary | ICD-10-CM

## 2019-04-18 MED ORDER — ATORVASTATIN CALCIUM 80 MG PO TABS: 80.0000 mg | ORAL_TABLET | Freq: Every day | ORAL | 9 refills | Status: DC

## 2019-04-18 NOTE — Telephone Encounter (Signed)
 *  STAT* If patient is at the pharmacy, call can be transferred to refill team.   1. Which medications need to be refilled? (please list name of each medication and dose if known) atorvastatin (LIPITOR) 80 MG tablet  2. Which pharmacy/location (including street and city if local pharmacy) is medication to be sent to? [facility]  3. Do they need a 30 day or 90 day supply? [place]

## 2019-04-21 ENCOUNTER — Other Ambulatory Visit: Payer: Medicare HMO | Admitting: *Deleted

## 2019-04-21 ENCOUNTER — Other Ambulatory Visit: Payer: Self-pay

## 2019-04-21 DIAGNOSIS — I2510 Atherosclerotic heart disease of native coronary artery without angina pectoris: Secondary | ICD-10-CM

## 2019-04-21 DIAGNOSIS — E7800 Pure hypercholesterolemia, unspecified: Secondary | ICD-10-CM

## 2019-04-21 LAB — LIPID PANEL
Chol/HDL Ratio: 3.3 ratio (ref 0.0–4.4)
Cholesterol, Total: 161 mg/dL (ref 100–199)
HDL: 49 mg/dL (ref >39)
LDL Chol Calc (NIH): 94 mg/dL (ref 0–99)
Triglycerides: 100 mg/dL (ref 0–149)
VLDL Cholesterol Cal: 18 mg/dL (ref 5–40)

## 2019-04-21 LAB — ALT: ALT: 15 IU/L (ref 0–32)

## 2019-04-24 ENCOUNTER — Telehealth: Payer: Self-pay | Admitting: *Deleted

## 2019-04-24 DIAGNOSIS — E7800 Pure hypercholesterolemia, unspecified: Secondary | ICD-10-CM

## 2019-04-24 DIAGNOSIS — I2510 Atherosclerotic heart disease of native coronary artery without angina pectoris: Secondary | ICD-10-CM

## 2019-04-24 MED ORDER — ATORVASTATIN CALCIUM 80 MG PO TABS: 80.0000 mg | ORAL_TABLET | Freq: Every day | ORAL | 6 refills | Status: DC

## 2019-04-24 NOTE — Telephone Encounter (Signed)
Called the pt to endorse her lab results per Dr Radford Pax.  Asked the pt if she has missed her statin or skipped doses, and pt states that she has missed about a week or so of taking her atorvastatin 80 mg po daily, for she talked to someone on the phone and they were to send this into her Pharmacy and never did.  When looking back the pt was right.  On 9/8 the person who was to send in her statin electronically accidentally hit the "no print" option, therefor it never e-scribed to her pharmacy.  Pt would like this to be sent to her confirmed pharmacy of choice, if [name] is ok with her to proceed with taking this dose of atorvastatin 80 mg po daily. Pt will need to have this sent in ASAP, since she has been without it for sometime, and now her LDL has increased.  Endorsed to the pt that I will just clarify with [name] that she still wants her on her atorvastatin 80 mg dose, and once she gets back to [place], we will call this in shortly thereafter.  Pt verbalized understanding and agrees with this plan.

## 2019-04-24 NOTE — Addendum Note (Signed)
Addended by: Nuala Alpha on: 04/24/2019 11:51 AM   Modules accepted: Orders

## 2019-04-24 NOTE — Telephone Encounter (Signed)
Called the pt and informed her that [name] would like for her to proceed with atorvastatin 80 mg po daily and she would like for her to come in for repeat FLP and ALT in 6 weeks.  Scheduled the pt to come in for labs on 10/26.  Pt is aware to come fasting to this lab appt. Informed the pt that I sent her refill for this med into her confirmed pharmacy of choice. Pt verbalized understanding and agrees with this plan.

## 2019-04-24 NOTE — Telephone Encounter (Signed)
Refill atorvastatin 80mg  daily and repeat FLP and ALT in 6 weeks

## 2019-04-24 NOTE — Telephone Encounter (Signed)
-----   Message from Sueanne Margarita, MD sent at 04/23/2019 10:43 AM EDT ----- LDL had been controlled and now elevated - please verify that patient is taking statin and has not missed doses

## 2019-06-05 ENCOUNTER — Other Ambulatory Visit: Payer: Self-pay

## 2019-06-05 ENCOUNTER — Other Ambulatory Visit: Payer: Medicare HMO | Admitting: *Deleted

## 2019-06-05 DIAGNOSIS — I2510 Atherosclerotic heart disease of native coronary artery without angina pectoris: Secondary | ICD-10-CM

## 2019-06-05 DIAGNOSIS — E7800 Pure hypercholesterolemia, unspecified: Secondary | ICD-10-CM

## 2019-06-05 LAB — LIPID PANEL
Chol/HDL Ratio: 2.5 ratio (ref 0.0–4.4)
Cholesterol, Total: 143 mg/dL (ref 100–199)
HDL: 57 mg/dL (ref >39)
LDL Chol Calc (NIH): 67 mg/dL (ref 0–99)
Triglycerides: 104 mg/dL (ref 0–149)
VLDL Cholesterol Cal: 19 mg/dL (ref 5–40)

## 2019-06-05 LAB — ALT: ALT: 12 IU/L (ref 0–32)

## 2019-07-08 ENCOUNTER — Other Ambulatory Visit: Payer: Self-pay | Admitting: Cardiology

## 2019-08-29 NOTE — Progress Notes (Signed)
Virtual Visit via telephone Note   This visit type was conducted due to national recommendations for restrictions regarding the COVID-19 Pandemic (e.g. social distancing) in an effort to limit this patient's exposure and mitigate transmission in our community.  Due to her co-morbid illnesses, this patient is at least at moderate risk for complications without adequate follow up.  This format is felt to be most appropriate for this patient at this time.  All issues noted in this document were discussed and addressed.  A limited physical exam was performed with this format.  Please refer to the patient's chart for her consent to telehealth for [facility].   Evaluation Performed:  Follow-up visit  This visit type was conducted due to national recommendations for restrictions regarding the COVID-19 Pandemic (e.g. social distancing).  This format is felt to be most appropriate for this patient at this time.  All issues noted in this document were discussed and addressed.  No physical exam was performed (except for noted visual exam findings with Video Visits).  Please refer to the patient's chart (MyChart message for video visits and phone note for telephone visits) for the patient's consent to telehealth for [facility].  Date:  08/30/2019   ID:  Brandi Frey, DOB 29-Sep-1940, MRN [id number]  Patient Location:  Home  Provider location:   [place]  PCP:  [name], MD  Cardiologist:  Fransico Him, MD  Electrophysiologist:  None   Chief Complaint:  Followup CAD, HLD, HTN  History of Present Illness:    Brandi Frey is a 79 y.o. female who presents via audio/video conferencing for a telehealth visit today.    Brandi Frey is a 79 y.o. 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  She is here today for followup and is doing well.  She denies any chest pain  or pressure, SOB, DOE, PND, orthopnea, LE edema, dizziness, palpitations or syncope. She is compliant with her meds and is tolerating meds with no SE.    The patient does not have symptoms concerning for COVID-19 infection (fever, chills, cough, or new shortness of breath).    Prior CV studies:   The following studies were reviewed today:  Labs, 2D echo, carotid dopplers  Past Medical History:  Diagnosis Date  . Cancer of left breast ([place]) 08/2002  . CAP (community acquired pneumonia) 09/18/2015  . Carotid arterial disease (HCC)    1-39% bilaterally by dopplers 11/2017  . Coronary artery disease    a. 2009: DES x2 to LAD in [facility]   b. 07/2015: NSTEMI s/p DES to LAD.  . Diabetes mellitus, type 2 ([place])   . Hyperlipidemia   . Hypertension   . Mitral regurgitation    Mild by echo 2018  . Myocardial infarction ([place]) 08/07/2015   "very light one"  . Persistent atrial fibrillation ([place])    a. newly diagnosed 07/2015 in the setting of NSTEMI. Spont conv to NSR/SB. Started on Eliquis for CHADS2VASC score of 5  . SVT (supraventricular tachycardia) (HCC)    Past Surgical History:  Procedure Laterality Date  . ABDOMINAL HYSTERECTOMY  1998  . APPENDECTOMY  ~ 1960  . BREAST BIOPSY Left 08/2002   "needle biopsy"  . BREAST LUMPECTOMY Left 08/2002  . CARDIAC CATHETERIZATION N/A 08/07/2015   Procedure: Left Heart Cath and Coronary Angiography;  Surgeon: Burnell Blanks, MD;  Location:  [place] INVASIVE CV LAB;  Service: Cardiovascular;  Laterality: N/A;  . CARDIAC CATHETERIZATION N/A 08/07/2015   Procedure: Coronary Stent Intervention;  Surgeon: Burnell Blanks, MD;  Location: [place] CV LAB;  Service: Cardiovascular;  Laterality: N/A;  mid LAD  . CORONARY ANGIOPLASTY WITH STENT PLACEMENT  2009   LAD x 2 with DES  . DILATION AND CURETTAGE OF UTERUS  1980s X 1   "lots of bleeding & passing clots"  . TUBAL LIGATION  1970     Current Meds  Medication Sig  . apixaban (ELIQUIS) 5 MG  TABS tablet Take 1 tablet (5 mg total) by mouth 2 (two) times daily.  Marland Kitchen atorvastatin (LIPITOR) 80 MG tablet Take 1 tablet (80 mg total) by mouth daily.  Marland Kitchen diltiazem (CARDIZEM) 60 MG tablet TAKE 1 TABLET BY MOUTH THREE TIMES A DAY  . glipiZIDE (GLUCOTROL XL) 5 MG 24 hr tablet Take 1 tablet by mouth at bedtime.  Marland Kitchen latanoprost (XALATAN) 0.005 % ophthalmic solution Place 1 drop into both eyes at bedtime.  Marland Kitchen levocetirizine (XYZAL) 5 MG tablet Take 5 mg by mouth at bedtime.  . metoprolol succinate (TOPROL-XL) 25 MG 24 hr tablet TAKE 1/2 TABLET BY MOUTH EVERY DAY IN THE MORNING     Allergies:   Latex, Tape, and Metformin   Social History   Tobacco Use  . Smoking status: Never Smoker  . Smokeless tobacco: Never Used  Substance Use Topics  . Alcohol use: Yes    Comment: 09/18/2015 "might have a glass of wine a few times/year"  . Drug use: No     Family Hx: The patient's family history includes CVA in her father; Diabetes in her brother; Heart attack in her brother; Heart disease in her brother; Hypertension in her sister; Ovarian cancer in her mother; Stroke in her father.  ROS:   Please see the history of present illness.     All other systems reviewed and are negative.   Labs/Other Tests and Data Reviewed:    Recent Labs: 06/05/2019: ALT 12   Recent Lipid Panel Lab Results  Component Value Date/Time   CHOL 143 06/05/2019 08:10 AM   TRIG 104 06/05/2019 08:10 AM   HDL 57 06/05/2019 08:10 AM   CHOLHDL 2.5 06/05/2019 08:10 AM   CHOLHDL 2.7 09/20/2015 07:20 AM   LDLCALC 67 06/05/2019 08:10 AM    Wt Readings from Last 3 Encounters:  08/30/19 163 lb (73.9 kg)  02/28/19 160 lb (72.6 kg)  08/29/18 164 lb 12.8 oz (74.8 kg)     Objective:    Vital Signs:  BP 134/75   Ht 5\' 5"  (1.651 m)   Wt 163 lb (73.9 kg)   BMI 27.12 kg/m    CONSTITUTIONAL:  Well nourished, well developed female in no acute distress.  EYES: anicteric MOUTH: oral mucosa is pink RESPIRATORY: Normal  respiratory effort, symmetric expansion CARDIOVASCULAR: No peripheral edema SKIN: No rash, lesions or ulcers MUSCULOSKELETAL: no digital cyanosis NEURO: Cranial Nerves II-XII grossly intact, moves all extremities PSYCH: Intact judgement and insight.  A&O x 3, Mood/affect appropriate   ASSESSMENT & PLAN:    1.  ASCAD - s/p DESx2 to LAD in 2009 and DES to LAD in 07/2015. -she denies any anginal sx -continue BB and statin -no ASA due to DOAC  2.  Persistent atrial fibrillation -she denies any palpitations -feels she is in NSR -continue Cardizem and Toprol -denies any bleeding problems on DOAC -continue Apixaban 5mg  BID -check BMET and CBC -  will order at her PCP office so she does not have to drive down here  3.  HTN -BP controlled -continue Cardizem 60mg  TID, Toprol XL 12.5mg  daily   4.  SVT -denies any palpitations -continue Cardizem and Toprol  5.  H/O pericardial effusion -remote hx of pleuropericarditis in 2016 -no effusion on echo 2018  6.  HLD -LDL goal < 70 -LDL 67 in Oct 2020 -continue Atorvastatin 40mg  daily -check FLP and ALT at PCP office in [place]  7.  Bilateral carotid artery stenosis -Dopplers 11/2017 showed 1 to 39% stenosis bilaterally -repeat dopplers 11/2019 -continue statin  COVID-19 Education: The signs and symptoms of COVID-19 were discussed with the patient and how to seek care for testing (follow up with PCP or arrange E-visit).  The importance of social distancing was discussed today.  Patient Risk:   After full review of this patient's clinical status, I feel that they are at least moderate risk at this time.  Time:   Today, I have spent 20 minutes directly with the patient on telemedicine discussing medical problems including CAD< afib, HTN, SVT, HLD, carotid dz.  We also reviewed the symptoms of COVID 19 and the ways to protect against contracting the virus with telehealth technology.  I spent an additional 10 minutes reviewing patient's chart  including carotid dopplers, labs, 2D echo and prior notes.  Medication Adjustments/Labs and Tests Ordered: Current medicines are reviewed at length with the patient today.  Concerns regarding medicines are outlined above.  Tests Ordered: No orders of the defined types were placed in this encounter.  Medication Changes: No orders of the defined types were placed in this encounter.   Disposition:  Follow up in 1 year(s)  Signed, Fransico Him, MD  08/30/2019 1:14 PM    [facility]

## 2019-08-30 ENCOUNTER — Other Ambulatory Visit: Payer: Self-pay

## 2019-08-30 ENCOUNTER — Encounter: Payer: Self-pay | Admitting: Cardiology

## 2019-08-30 ENCOUNTER — Telehealth (INDEPENDENT_AMBULATORY_CARE_PROVIDER_SITE_OTHER): Payer: Medicare HMO | Admitting: Cardiology

## 2019-08-30 VITALS — BP 134/75 | Ht 65.0 in | Wt 163.0 lb

## 2019-08-30 DIAGNOSIS — I3139 Other pericardial effusion (noninflammatory): Secondary | ICD-10-CM

## 2019-08-30 DIAGNOSIS — I313 Pericardial effusion (noninflammatory): Secondary | ICD-10-CM

## 2019-08-30 DIAGNOSIS — I471 Supraventricular tachycardia: Secondary | ICD-10-CM

## 2019-08-30 DIAGNOSIS — I4819 Other persistent atrial fibrillation: Secondary | ICD-10-CM

## 2019-08-30 DIAGNOSIS — I2510 Atherosclerotic heart disease of native coronary artery without angina pectoris: Secondary | ICD-10-CM

## 2019-08-30 DIAGNOSIS — I6523 Occlusion and stenosis of bilateral carotid arteries: Secondary | ICD-10-CM

## 2019-08-30 DIAGNOSIS — I10 Essential (primary) hypertension: Secondary | ICD-10-CM | POA: Diagnosis not present

## 2019-08-30 DIAGNOSIS — E7800 Pure hypercholesterolemia, unspecified: Secondary | ICD-10-CM

## 2019-08-30 MED ORDER — ATORVASTATIN CALCIUM 80 MG PO TABS: 80.0000 mg | ORAL_TABLET | Freq: Every day | ORAL | 6 refills | Status: DC

## 2019-08-30 MED ORDER — METOPROLOL SUCCINATE ER 25 MG PO TB24: ORAL_TABLET | ORAL | 3 refills | Status: DC

## 2019-08-30 MED ORDER — DILTIAZEM HCL 60 MG PO TABS: 60.0000 mg | ORAL_TABLET | Freq: Three times a day (TID) | ORAL | 3 refills | Status: DC

## 2019-08-30 MED ORDER — APIXABAN 5 MG PO TABS: 5.0000 mg | ORAL_TABLET | Freq: Two times a day (BID) | ORAL | 6 refills | Status: DC

## 2019-08-30 NOTE — Patient Instructions (Signed)
Medication Instructions:  Your physician recommends that you continue on your current medications as directed. Please refer to the Current Medication list given to you today.  *If you need a refill on your cardiac medications before your next appointment, please call your pharmacy*  Lab Work: Fasting lipids and CMET to be done at your PCP.  If you have labs (blood work) drawn today and your tests are completely normal, you will receive your results only by: Marland Kitchen MyChart Message (if you have MyChart) OR . A paper copy in the mail If you have any lab test that is abnormal or we need to change your treatment, we will call you to review the results.  Testing/Procedures: Your physician has requested that you have a carotid duplex. This test is an ultrasound of the carotid arteries in your neck. It looks at blood flow through these arteries that supply the brain with blood. Allow one hour for this exam. There are no restrictions or special instructions.  Follow-Up: At [facility], you and your health needs are our priority.  As part of our continuing mission to provide you with exceptional heart care, we have created designated Provider Care Teams.  These Care Teams include your primary Cardiologist (physician) and Advanced Practice Providers (APPs -  Physician Assistants and Nurse Practitioners) who all work together to provide you with the care you need, when you need it.  Your next appointment:   6 month(s)  The format for your next appointment:   Virtual Visit   Provider:   Fransico Him, MD

## 2019-11-20 ENCOUNTER — Ambulatory Visit (HOSPITAL_COMMUNITY)
Admission: RE | Admit: 2019-11-20 | Discharge: 2019-11-20 | Disposition: A | Discharge status: Home / Self Care | Payer: Medicare HMO | Source: Ambulatory Visit | Attending: Internal Medicine | Admitting: Internal Medicine

## 2019-11-20 ENCOUNTER — Encounter: Payer: Self-pay | Admitting: Cardiology

## 2019-11-20 ENCOUNTER — Other Ambulatory Visit: Payer: Self-pay

## 2019-11-20 DIAGNOSIS — I6523 Occlusion and stenosis of bilateral carotid arteries: Secondary | ICD-10-CM | POA: Insufficient documentation

## 2019-11-21 ENCOUNTER — Telehealth: Payer: Self-pay

## 2019-11-21 DIAGNOSIS — I6523 Occlusion and stenosis of bilateral carotid arteries: Secondary | ICD-10-CM

## 2019-11-21 NOTE — Telephone Encounter (Signed)
Left message for patient with results. Advised to call back with any questions. Recall placed for repeat dopplers in 2 years.

## 2019-11-21 NOTE — Telephone Encounter (Signed)
-----   Message from Sueanne Margarita, MD sent at 11/20/2019  9:48 PM EDT ----- 1-39% bilateral carotid artery stenosis - repeat dopplers in 2 years.

## 2020-02-26 ENCOUNTER — Encounter: Payer: Self-pay | Admitting: *Deleted

## 2020-02-26 NOTE — Telephone Encounter (Signed)
This encounter was created in error - please disregard.

## 2020-02-29 ENCOUNTER — Other Ambulatory Visit: Payer: Self-pay

## 2020-02-29 ENCOUNTER — Encounter: Payer: Self-pay | Admitting: Cardiology

## 2020-02-29 ENCOUNTER — Telehealth (INDEPENDENT_AMBULATORY_CARE_PROVIDER_SITE_OTHER): Payer: Medicare HMO | Admitting: Cardiology

## 2020-02-29 VITALS — BP 132/78 | HR 51 | Ht 65.0 in | Wt 157.0 lb

## 2020-02-29 DIAGNOSIS — I3139 Other pericardial effusion (noninflammatory): Secondary | ICD-10-CM

## 2020-02-29 DIAGNOSIS — I6523 Occlusion and stenosis of bilateral carotid arteries: Secondary | ICD-10-CM

## 2020-02-29 DIAGNOSIS — I2510 Atherosclerotic heart disease of native coronary artery without angina pectoris: Secondary | ICD-10-CM | POA: Diagnosis not present

## 2020-02-29 DIAGNOSIS — I10 Essential (primary) hypertension: Secondary | ICD-10-CM | POA: Diagnosis not present

## 2020-02-29 DIAGNOSIS — I471 Supraventricular tachycardia: Secondary | ICD-10-CM | POA: Diagnosis not present

## 2020-02-29 DIAGNOSIS — I4819 Other persistent atrial fibrillation: Secondary | ICD-10-CM

## 2020-02-29 DIAGNOSIS — I313 Pericardial effusion (noninflammatory): Secondary | ICD-10-CM

## 2020-02-29 NOTE — Progress Notes (Signed)
Virtual Visit via telephone Note   This visit type was conducted due to national recommendations for restrictions regarding the COVID-19 Pandemic (e.g. social distancing) in an effort to limit this patient's exposure and mitigate transmission in our community.  Due to her co-morbid illnesses, this patient is at least at moderate risk for complications without adequate follow up.  This format is felt to be most appropriate for this patient at this time.  All issues noted in this document were discussed and addressed.  A limited physical exam was performed with this format.  Please refer to the patient's chart for her consent to telehealth for [facility].   Evaluation Performed:  Follow-up visit  This visit type was conducted due to national recommendations for restrictions regarding the COVID-19 Pandemic (e.g. social distancing).  This format is felt to be most appropriate for this patient at this time.  All issues noted in this document were discussed and addressed.  No physical exam was performed (except for noted visual exam findings with Video Visits).  Please refer to the patient's chart (MyChart message for video visits and phone note for telephone visits) for the patient's consent to telehealth for [facility].  Date:  02/29/2020   ID:  Brandi Frey, DOB Nov 19, 1940, MRN [id number]  Patient Location:  Home  Provider location:   [place]  PCP:  [name], MD  Cardiologist:  Fransico Him, MD  Electrophysiologist:  None   Chief Complaint:  Followup CAD, HLD, HTN  History of Present Illness:    Brandi Frey is a 79 y.o. female who presents via audio/video conferencing for a telehealth visit today.    Brandi Frey is a 79 y.o. 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  SHe is here today for followup and is doing well.  She denies any chest pain  or pressure, SOB, DOE, PND, orthopnea, LE edema, dizziness, palpitations or syncope. She is compliant with her meds and is tolerating meds with no SE.    The patient does not have symptoms concerning for COVID-19 infection (fever, chills, cough, or new shortness of breath).    Prior CV studies:   The following studies were reviewed today:  Labs, 2D echo, carotid dopplers  Past Medical History:  Diagnosis Date  . Cancer of left breast ([place]) 08/2002  . CAP (community acquired pneumonia) 09/18/2015  . Carotid arterial disease (HCC)    1-39% bilaterally by dopplers 11/2019  . Coronary artery disease    a. 2009: DES x2 to LAD in [facility]   b. 07/2015: NSTEMI s/p DES to LAD.  . Diabetes mellitus, type 2 ([place])   . Hyperlipidemia   . Hypertension   . Mitral regurgitation    Mild by echo 2018  . Myocardial infarction ([place]) 08/07/2015   "very light one"  . Persistent atrial fibrillation ([place])    a. newly diagnosed 07/2015 in the setting of NSTEMI. Spont conv to NSR/SB. Started on Eliquis for CHADS2VASC score of 5  . SVT (supraventricular tachycardia) (HCC)    Past Surgical History:  Procedure Laterality Date  . ABDOMINAL HYSTERECTOMY  1998  . APPENDECTOMY  ~ 1960  . BREAST BIOPSY Left 08/2002   "needle biopsy"  . BREAST LUMPECTOMY Left 08/2002  . CARDIAC CATHETERIZATION N/A 08/07/2015   Procedure: Left Heart Cath and Coronary Angiography;  Surgeon: Burnell Blanks, MD;  Location:  [place] INVASIVE CV LAB;  Service: Cardiovascular;  Laterality: N/A;  . CARDIAC CATHETERIZATION N/A 08/07/2015   Procedure: Coronary Stent Intervention;  Surgeon: Burnell Blanks, MD;  Location: [place] CV LAB;  Service: Cardiovascular;  Laterality: N/A;  mid LAD  . CORONARY ANGIOPLASTY WITH STENT PLACEMENT  2009   LAD x 2 with DES  . DILATION AND CURETTAGE OF UTERUS  1980s X 1   "lots of bleeding & passing clots"  . TUBAL LIGATION  1970     Current Meds  Medication Sig  . apixaban (ELIQUIS) 5 MG  TABS tablet Take 1 tablet (5 mg total) by mouth 2 (two) times daily.  Marland Kitchen atorvastatin (LIPITOR) 80 MG tablet Take 1 tablet (80 mg total) by mouth daily.  Marland Kitchen diltiazem (CARDIZEM) 60 MG tablet Take 1 tablet (60 mg total) by mouth 3 (three) times daily.  Marland Kitchen glipiZIDE (GLUCOTROL XL) 5 MG 24 hr tablet Take 1 tablet by mouth at bedtime.  Marland Kitchen latanoprost (XALATAN) 0.005 % ophthalmic solution Place 1 drop into both eyes at bedtime.  . metoprolol succinate (TOPROL-XL) 25 MG 24 hr tablet TAKE 1/2 TABLET BY MOUTH EVERY DAY IN THE MORNING     Allergies:   Latex, Tape, and Metformin   Social History   Tobacco Use  . Smoking status: Never Smoker  . Smokeless tobacco: Never Used  Vaping Use  . Vaping Use: Never used  Substance Use Topics  . Alcohol use: Yes    Comment: 09/18/2015 "might have a glass of wine a few times/year"  . Drug use: No     Family Hx: The patient's family history includes CVA in her father; Diabetes in her brother; Heart attack in her brother; Heart disease in her brother; Hypertension in her sister; Ovarian cancer in her mother; Stroke in her father.  ROS:   Please see the history of present illness.     All other systems reviewed and are negative.   Labs/Other Tests and Data Reviewed:    Recent Labs: 06/05/2019: ALT 12   Recent Lipid Panel Lab Results  Component Value Date/Time   CHOL 143 06/05/2019 08:10 AM   TRIG 104 06/05/2019 08:10 AM   HDL 57 06/05/2019 08:10 AM   CHOLHDL 2.5 06/05/2019 08:10 AM   CHOLHDL 2.7 09/20/2015 07:20 AM   LDLCALC 67 06/05/2019 08:10 AM    Wt Readings from Last 3 Encounters:  02/29/20 157 lb (71.2 kg)  08/30/19 163 lb (73.9 kg)  02/28/19 160 lb (72.6 kg)     Objective:    Vital Signs:  BP 132/78   Pulse (!) 51   Ht 5\' 5"  (1.651 m)   Wt 157 lb (71.2 kg)   BMI 26.13 kg/m     ASSESSMENT & PLAN:    1.  ASCAD - s/p DESx2 to LAD in 2009 and DES to LAD in 07/2015. -she has not had any anginal sx since I saw her  last -continue BB and statin -no ASA due to DOAC  2.  Persistent atrial fibrillation -she has not had any palpitations since I saw her last -continue Cardizem 60mg  TID and Toprol XL 12.5mg  daily -denies any bleeding problems on DOAC -continue Apixaban 5mg  BID -Creatinine was normal at 0.85 and Hbg 13.3 earlier this month  3.  HTN -BP is well controlled -continue Cardizem 60mg  TID, Toprol XL 12.5mg  daily   4.  SVT -no palpitations -continue Cardizem and Toprol  5.  H/O pericardial effusion -remote hx of pleuropericarditis in 2016 -  no effusion on echo 2018  6.  HLD -LDL goal < 70 -LDL 68 in Oct July 2021 -continue Atorvastatin 80mg  daily  7.  Bilateral carotid artery stenosis -Dopplers 11/2019 showed 1-39% bilateral stenosis -repeat dopplers 11/2021 -continue statin   COVID-19 Education: The signs and symptoms of COVID-19 were discussed with the patient and how to seek care for testing (follow up with PCP or arrange E-visit).  The importance of social distancing was discussed today.  She has not had the COVID 19 vaccine.  Patient Risk:   After full review of this patient's clinical status, I feel that they are at least moderate risk at this time.  Time:   Today, I have spent 20 minutes on telemedicine discussing medical problems including CAD, PAF, HTN, SVT, HLD, carotid dz and reviewing patient's chart including carotid dopplers, outside  Labs from PCP, 2D echo and prior notes.  Medication Adjustments/Labs and Tests Ordered: Current medicines are reviewed at length with the patient today.  Concerns regarding medicines are outlined above.  Tests Ordered: No orders of the defined types were placed in this encounter.  Medication Changes: No orders of the defined types were placed in this encounter.   Disposition:  Follow up in 1 year(s)  Signed, Fransico Him, MD  02/29/2020 8:35 AM    [facility]

## 2020-02-29 NOTE — Patient Instructions (Signed)

## 2020-04-04 ENCOUNTER — Telehealth: Payer: Self-pay | Admitting: *Deleted

## 2020-04-04 NOTE — Telephone Encounter (Signed)
   Primary Cardiologist: Fransico Him, MD  Chart reviewed as part of pre-operative protocol coverage.   Simple dental extractions are considered low risk procedures per guidelines and generally do not require any specific cardiac clearance. It is also generally accepted that for simple extractions and dental cleanings, there is no need to interrupt blood thinner therapy.   SBE prophylaxis is not required for the patient.  I will route this recommendation to the requesting party via Epic fax function and remove from pre-op pool.  Please call with questions.  [place], PA 04/04/2020, 3:28 PM

## 2020-04-04 NOTE — Telephone Encounter (Signed)
   [facility] Pre-operative Risk Assessment    HEARTCARE STAFF: - Please ensure there is not already an duplicate clearance open for this procedure. - Under Visit Info/Reason for Call, type in Other and utilize the format Clearance MM/DD/YY or Clearance TBD. Do not use dashes or single digits. - If request is for dental extraction, please clarify the # of teeth to be extracted.  Request for surgical clearance:  1. What type of surgery is being performed? 1 TOOTH TO BE EXTRACTED CONFIRMED WITH DENTAL OFFICE   2. When is this surgery scheduled? TBD   3. What type of clearance is required (medical clearance vs. Pharmacy clearance to hold med vs. Both)? BOTH  4. Are there any medications that need to be held prior to surgery and how long? ELIQUIS   5. Practice name and name of physician performing surgery? J. Silverthorne, DDS   6. What is the office phone number? [phone]   7.   What is the office fax number? [phone]  8.   Anesthesia type (None, local, MAC, general) ? LOCAL   Julaine Hua 04/04/2020, 12:20 PM  _________________________________________________________________   (provider comments below)

## 2020-04-05 NOTE — Telephone Encounter (Signed)
Patient is inquiring about whether or not she will need to hold Eliquis prior to her procedure. Please advise.

## 2020-04-05 NOTE — Telephone Encounter (Signed)
Called Mrs. Whiston back. Busy signal the first time, no answer the second time.  Sent message in My Chart letting her know she does not need to hold Plavix for dental procedure.    Jory Sims DNP, ANP

## 2020-05-03 ENCOUNTER — Other Ambulatory Visit: Payer: Self-pay | Admitting: Cardiology

## 2020-05-03 DIAGNOSIS — E7800 Pure hypercholesterolemia, unspecified: Secondary | ICD-10-CM

## 2020-05-03 DIAGNOSIS — I2510 Atherosclerotic heart disease of native coronary artery without angina pectoris: Secondary | ICD-10-CM

## 2020-07-10 ENCOUNTER — Other Ambulatory Visit: Payer: Self-pay | Admitting: Cardiology

## 2020-07-10 NOTE — Telephone Encounter (Signed)
Eliquis 5mg  refill request received. Patient is 79 years old, weight-71.2kg, Crea-0.85 on 02/21/2020 via scanned labs from [facility] [place], and last seen by [name] on 08/30/2019 via Telemedicine Visit. Dose is appropriate based on dosing criteria. Will send in refill to requested pharmacy.

## 2020-07-30 ENCOUNTER — Other Ambulatory Visit: Payer: Self-pay | Admitting: Cardiology

## 2020-07-30 DIAGNOSIS — I2510 Atherosclerotic heart disease of native coronary artery without angina pectoris: Secondary | ICD-10-CM

## 2020-07-30 DIAGNOSIS — E7800 Pure hypercholesterolemia, unspecified: Secondary | ICD-10-CM

## 2020-07-30 NOTE — Telephone Encounter (Signed)
Outpatient Medication Detail   Disp Refills Start End   atorvastatin (LIPITOR) 80 MG tablet 90 tablet 2 05/03/2020    Sig: TAKE 1 TABLET BY MOUTH EVERY DAY   Sent to pharmacy as: atorvastatin (LIPITOR) 80 MG tablet   E-Prescribing Status: Receipt confirmed by pharmacy (05/03/2020 11:06 AM EDT)     Associated Diagnoses  Coronary artery disease involving native coronary artery of native heart without angina pectoris     Pure hypercholesterolemia      Pharmacy  [id number] - [place], [place].

## 2020-08-28 ENCOUNTER — Other Ambulatory Visit: Payer: Self-pay

## 2020-08-28 ENCOUNTER — Encounter: Payer: Self-pay | Admitting: Cardiology

## 2020-08-28 ENCOUNTER — Ambulatory Visit: Payer: Medicare HMO | Admitting: Cardiology

## 2020-08-28 VITALS — BP 130/70 | HR 50 | Ht 65.0 in | Wt 156.4 lb

## 2020-08-28 DIAGNOSIS — I6523 Occlusion and stenosis of bilateral carotid arteries: Secondary | ICD-10-CM

## 2020-08-28 DIAGNOSIS — I3139 Other pericardial effusion (noninflammatory): Secondary | ICD-10-CM

## 2020-08-28 DIAGNOSIS — I2510 Atherosclerotic heart disease of native coronary artery without angina pectoris: Secondary | ICD-10-CM | POA: Diagnosis not present

## 2020-08-28 DIAGNOSIS — E7800 Pure hypercholesterolemia, unspecified: Secondary | ICD-10-CM

## 2020-08-28 DIAGNOSIS — I471 Supraventricular tachycardia: Secondary | ICD-10-CM

## 2020-08-28 DIAGNOSIS — I480 Paroxysmal atrial fibrillation: Secondary | ICD-10-CM | POA: Diagnosis not present

## 2020-08-28 DIAGNOSIS — I10 Essential (primary) hypertension: Secondary | ICD-10-CM | POA: Diagnosis not present

## 2020-08-28 DIAGNOSIS — I313 Pericardial effusion (noninflammatory): Secondary | ICD-10-CM

## 2020-08-28 NOTE — Progress Notes (Signed)
Date:  08/28/2020   ID:  Brandi Frey, DOB [birth year], MRN [id number]   PCP:  [name], MD  Cardiologist:  Fransico Him, MD  Electrophysiologist:  None   Chief Complaint:  Followup CAD, HLD, HTN  History of Present Illness:    Brandi Frey is a 80 y.o. 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  SHe is here today for followup and is doing well.  She denies any chest pain or pressure, SOB, DOE, PND, orthopnea, LE edema, dizziness, palpitations or syncope. She is compliant with her meds and is tolerating meds with no SE.    Prior CV studies:   The following studies were reviewed today:  EKG  Past Medical History:  Diagnosis Date  . 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 CAP (community acquired pneumonia) 09/18/2015  . Carotid arterial disease (HCC)    1-39% bilaterally by dopplers 11/2019  . Coronary artery disease    a. 2009: DES x2 to LAD in [facility]   b. 07/2015: NSTEMI s/p DES to LAD.  . Diabetes mellitus, type 2 ([place])   . Hyperlipidemia   . Hypertension   . Mitral regurgitation    Mild by echo 2018  . Myocardial infarction ([place]) 08/07/2015   "very light one"  . Persistent atrial fibrillation ([place])    a. newly diagnosed 07/2015 in the setting of NSTEMI. Spont conv to NSR/SB. Started on Eliquis for CHADS2VASC score of 5  . SVT (supraventricular tachycardia) (HCC)    Past Surgical History:  Procedure Laterality Date  . ABDOMINAL HYSTERECTOMY  1998  . APPENDECTOMY  ~ 1960  . BREAST BIOPSY Left 08/2002   "needle biopsy"  . BREAST LUMPECTOMY Left 08/2002  . CARDIAC CATHETERIZATION N/A 08/07/2015   Procedure: Left Heart Cath and Coronary Angiography;  Surgeon: Burnell Blanks, MD;  Location: [place] CV LAB;  Service: Cardiovascular;  Laterality: N/A;  . CARDIAC CATHETERIZATION N/A 08/07/2015   Procedure: Coronary Stent Intervention;   Surgeon: Burnell Blanks, MD;  Location: [place] CV LAB;  Service: Cardiovascular;  Laterality: N/A;  mid LAD  . CORONARY ANGIOPLASTY WITH STENT PLACEMENT  2009   LAD x 2 with DES  . DILATION AND CURETTAGE OF UTERUS  1980s X 1   "lots of bleeding & passing clots"  . TUBAL LIGATION  1970     Current Meds  Medication Sig  . atorvastatin (LIPITOR) 80 MG tablet TAKE 1 TABLET BY MOUTH EVERY DAY  . diltiazem (CARDIZEM) 60 MG tablet Take 1 tablet (60 mg total) by mouth 3 (three) times daily.  Marland Kitchen ELIQUIS 5 MG TABS tablet TAKE 1 TABLET BY MOUTH TWICE A DAY  . glipiZIDE (GLUCOTROL XL) 5 MG 24 hr tablet Take 1 tablet by mouth at bedtime.  Marland Kitchen latanoprost (XALATAN) 0.005 % ophthalmic solution Place 1 drop into both eyes at bedtime.  . metoprolol succinate (TOPROL-XL) 25 MG 24 hr tablet TAKE 1/2 TABLET BY MOUTH EVERY DAY IN THE MORNING     Allergies:   Latex, Tape, and Metformin   Social History   Tobacco Use  . Smoking status: Never Smoker  . Smokeless tobacco: Never Used  Vaping Use  . Vaping Use: Never used  Substance Use Topics  . Alcohol use: Yes    Comment: 09/18/2015 "might have a glass of wine a few times/year"  . Drug  use: No     Family Hx: The patient's family history includes CVA in her father; Diabetes in her brother; Heart attack in her brother; Heart disease in her brother; Hypertension in her sister; Ovarian cancer in her mother; Stroke in her father.  ROS:   Please see the history of present illness.     All other systems reviewed and are negative.   Labs/Other Tests and Data Reviewed:    Recent Labs: No results found for requested labs within last 8760 hours.   Recent Lipid Panel Lab Results  Component Value Date/Time   CHOL 143 06/05/2019 08:10 AM   TRIG 104 06/05/2019 08:10 AM   HDL 57 06/05/2019 08:10 AM   CHOLHDL 2.5 06/05/2019 08:10 AM   CHOLHDL 2.7 09/20/2015 07:20 AM   LDLCALC 67 06/05/2019 08:10 AM    Wt Readings from Last 3 Encounters:   08/28/20 156 lb 6.4 oz (70.9 kg)  02/29/20 157 lb (71.2 kg)  08/30/19 163 lb (73.9 kg)     Objective:    Vital Signs:  BP 130/70   Pulse (!) 50   Ht 5\' 5"  (1.651 m)   Wt 156 lb 6.4 oz (70.9 kg)   SpO2 96%   BMI 26.03 kg/m    GEN: Well nourished, well developed in no acute distress HEENT: Normal NECK: No JVD; No carotid bruits LYMPHATICS: No lymphadenopathy CARDIAC:RRR, no murmurs, rubs, gallops RESPIRATORY:  Clear to auscultation without rales, wheezing or rhonchi  ABDOMEN: Soft, non-tender, non-distended MUSCULOSKELETAL:  No edema; No deformity  SKIN: Warm and dry NEUROLOGIC:  Alert and oriented x 3 PSYCHIATRIC:  Normal affect   EKG was performed today and showed sinus bradycardia with low voltage QRS  ASSESSMENT & PLAN:    1.  ASCAD - s/p DESx2 to LAD in 2009 and DES to LAD in 07/2015. -she denies any anginal symptoms -continue BB and statin -no ASA due to DOAC  2.  Paroxysmal atrial fibrillation -she is maintaining NSR and denies any palpitations -continue Cardizem 60mg  TID and Toprol XL 12.5mg  daily -denies any bleeding problems on DOAC -continue Apixaban 5mg  BID -check BMET and CBC  3.  HTN -BP is well controlled on exam -continue Cardizem 60mg  TID, Toprol XL 12.5mg  daily   4.  SVT -she denies any palpitations since I saw her last -continue Cardizem and Toprol  5.  H/O pericardial effusion -remote hx of pleuropericarditis in 2016 -no effusion on echo 2018  6.  HLD -LDL goal < 70 -check FLP and ALT -continue Atorvastatin 80mg  daily  7.  Bilateral carotid artery stenosis -Dopplers 11/2019 showed 1-39% bilateral stenosis -repeat dopplers 11/2021 -continue statin  Medication Adjustments/Labs and Tests Ordered: Current medicines are reviewed at length with the patient today.  Concerns regarding medicines are outlined above.  Tests Ordered: Orders Placed This Encounter  Procedures  . EKG 12-Lead   Medication Changes: No orders of the defined  types were placed in this encounter.   Disposition:  Follow up in 1 year(s)  Signed, Fransico Him, MD  08/28/2020 9:42 AM    [facility]

## 2020-08-28 NOTE — Addendum Note (Signed)
Addended by: Antonieta Iba on: 08/28/2020 09:48 AM   Modules accepted: Orders

## 2020-08-28 NOTE — Patient Instructions (Signed)
Medication Instructions:  Your physician recommends that you continue on your current medications as directed. Please refer to the Current Medication list given to you today.  *If you need a refill on your cardiac medications before your next appointment, please call your pharmacy*   Lab Work: TODAY: CMET, FLP If you have labs (blood work) drawn today and your tests are completely normal, you will receive your results only by: . MyChart Message (if you have MyChart) OR . A paper copy in the mail If you have any lab test that is abnormal or we need to change your treatment, we will call you to review the results.   Follow-Up: At [facility], you and your health needs are our priority.  As part of our continuing mission to provide you with exceptional heart care, we have created designated Provider Care Teams.  These Care Teams include your primary Cardiologist (physician) and Advanced Practice Providers (APPs -  Physician Assistants and Nurse Practitioners) who all work together to provide you with the care you need, when you need it.  Your next appointment:   1 year(s)  The format for your next appointment:   In Person  Provider:   You may see Traci [place], MD or one of the following Advanced Practice Providers on your designated Care Team:    Dayna Dunn, PA-C  Michele Lenze, PA-C     

## 2020-08-29 ENCOUNTER — Telehealth: Payer: Self-pay | Admitting: *Deleted

## 2020-08-29 DIAGNOSIS — E7800 Pure hypercholesterolemia, unspecified: Secondary | ICD-10-CM

## 2020-08-29 DIAGNOSIS — I2510 Atherosclerotic heart disease of native coronary artery without angina pectoris: Secondary | ICD-10-CM

## 2020-08-29 LAB — COMPREHENSIVE METABOLIC PANEL
ALT: 20 IU/L (ref 0–32)
AST: 19 IU/L (ref 0–40)
Albumin/Globulin Ratio: 1.1 — ABNORMAL LOW (ref 1.2–2.2)
Albumin: 4.1 g/dL (ref 3.7–4.7)
Alkaline Phosphatase: 115 IU/L (ref 44–121)
BUN/Creatinine Ratio: 20 (ref 12–28)
BUN: 18 mg/dL (ref 8–27)
Bilirubin Total: 0.5 mg/dL (ref 0.0–1.2)
CO2: 24 mmol/L (ref 20–29)
Calcium: 9.5 mg/dL (ref 8.7–10.3)
Chloride: 101 mmol/L (ref 96–106)
Creatinine, Ser: 0.9 mg/dL (ref 0.57–1.00)
GFR calc Af Amer: 71 mL/min/{1.73_m2} (ref >59)
GFR calc non Af Amer: 62 mL/min/{1.73_m2} (ref >59)
Globulin, Total: 3.7 g/dL (ref 1.5–4.5)
Glucose: 177 mg/dL — ABNORMAL HIGH (ref 65–99)
Potassium: 5.3 mmol/L — ABNORMAL HIGH (ref 3.5–5.2)
Sodium: 138 mmol/L (ref 134–144)
Total Protein: 7.8 g/dL (ref 6.0–8.5)

## 2020-08-29 LAB — LIPID PANEL
Chol/HDL Ratio: 2.7 ratio (ref 0.0–4.4)
Cholesterol, Total: 156 mg/dL (ref 100–199)
HDL: 58 mg/dL (ref >39)
LDL Chol Calc (NIH): 79 mg/dL (ref 0–99)
Triglycerides: 104 mg/dL (ref 0–149)
VLDL Cholesterol Cal: 19 mg/dL (ref 5–40)

## 2020-08-29 MED ORDER — EZETIMIBE 10 MG PO TABS: 10.0000 mg | ORAL_TABLET | Freq: Every day | ORAL | 3 refills | Status: DC

## 2020-08-29 NOTE — Telephone Encounter (Signed)
Pt has been notified of lab results and is agreeable to plan of care. [facility] FLP/ALT has been scheduled for 10/28/20. Pt thanked me for the call and the help. Patient notified of result.  Please refer to phone note from today for complete details.   Julaine Hua, [place] 08/29/2020 10:01 AM

## 2020-08-29 NOTE — Telephone Encounter (Signed)
-----   Message from Sueanne Margarita, MD sent at 08/29/2020  8:42 AM EST ----- LDL not at goal of < 70.  Add zetia 10mg  daily and repeat FLp and ALT in 8 weeks

## 2020-09-02 ENCOUNTER — Telehealth: Payer: Self-pay | Admitting: Cardiology

## 2020-09-02 NOTE — Telephone Encounter (Signed)
Spoke with the patient and advised her that the goal for her LDL is <70. She was concerned about the high dose of atorvastatin. She states she has been tolerating the atorvastatin and zetia. Advised to continue both medications as well as work on diet and exercise. Patient verbalized understanding.

## 2020-09-02 NOTE — Telephone Encounter (Signed)
Pt c/o medication issue:  1. Name of Medication:  ezetimibe (ZETIA) 10 MG tablet atorvastatin (LIPITOR) 80 MG tablet  2. How are you currently taking this medication (dosage and times per day)?  Taking both as directed   3. Are you having a reaction (difficulty breathing--STAT)?  No reactions   4. What is your medication issue?  Patient states her daughter was concerned that this is too much cholesterol medication for her to be taking. Patient states she thinks her cholesterol was higher when it was checked due to the extra foods she was eating over the holidays but that she is not eating meals like that anymore. She states she just wants to be sure that she should be on this much cholesterol medication. Please call/advise  Thank you!

## 2020-10-24 ENCOUNTER — Other Ambulatory Visit: Payer: Self-pay | Admitting: Cardiology

## 2020-10-28 ENCOUNTER — Other Ambulatory Visit: Payer: Self-pay

## 2020-10-28 ENCOUNTER — Other Ambulatory Visit: Payer: Medicare HMO | Admitting: *Deleted

## 2020-10-28 DIAGNOSIS — I2510 Atherosclerotic heart disease of native coronary artery without angina pectoris: Secondary | ICD-10-CM

## 2020-10-28 DIAGNOSIS — E7800 Pure hypercholesterolemia, unspecified: Secondary | ICD-10-CM

## 2020-10-28 LAB — LIPID PANEL
Chol/HDL Ratio: 2.2 ratio (ref 0.0–4.4)
Cholesterol, Total: 136 mg/dL (ref 100–199)
HDL: 61 mg/dL (ref >39)
LDL Chol Calc (NIH): 60 mg/dL (ref 0–99)
Triglycerides: 73 mg/dL (ref 0–149)
VLDL Cholesterol Cal: 15 mg/dL (ref 5–40)

## 2020-10-28 LAB — HEPATIC FUNCTION PANEL
ALT: 18 IU/L (ref 0–32)
AST: 19 IU/L (ref 0–40)
Albumin: 4.2 g/dL (ref 3.7–4.7)
Alkaline Phosphatase: 104 IU/L (ref 44–121)
Bilirubin Total: 0.8 mg/dL (ref 0.0–1.2)
Bilirubin, Direct: 0.19 mg/dL (ref 0.00–0.40)
Total Protein: 7.4 g/dL (ref 6.0–8.5)

## 2020-11-18 ENCOUNTER — Other Ambulatory Visit: Payer: Self-pay | Admitting: Cardiology

## 2020-11-18 NOTE — Telephone Encounter (Signed)
*  STAT* If patient is at the pharmacy, call can be transferred to refill team.   1. Which medications need to be refilled? (please list name of each medication and dose if known) Diltiazem- pt need tomorrow will be out after tomorrowl  2. Which pharmacy/location (including street and city if local pharmacy) is medication to be sent to[facility] [place]  3. Do they need a 30 day or 90 day supply? #[id number] and refills

## 2020-11-19 ENCOUNTER — Encounter (HOSPITAL_COMMUNITY): Payer: Medicare HMO

## 2020-12-09 ENCOUNTER — Other Ambulatory Visit: Payer: Self-pay | Admitting: Cardiology

## 2020-12-09 NOTE — Telephone Encounter (Signed)
Prescription refill request for Eliquis received.  Indication: afib  Last office visit: Brandi Frey, 08/28/2020 Scr: 0.90, 08/28/2020 Age: 80 yo  Weight: 70.9 kg   Pt is on the correct dose of Eliquis per dosing criteria, prescription refill sent for Eliquis 5 mg BID.

## 2021-01-15 ENCOUNTER — Other Ambulatory Visit: Payer: Self-pay

## 2021-01-15 ENCOUNTER — Encounter (HOSPITAL_COMMUNITY): Payer: Self-pay

## 2021-01-15 ENCOUNTER — Ambulatory Visit (HOSPITAL_COMMUNITY)
Admission: RE | Admit: 2021-01-15 | Discharge: 2021-01-15 | Disposition: A | Discharge status: Home / Self Care | Payer: Medicare HMO | Source: Ambulatory Visit | Attending: Cardiovascular Disease | Admitting: Cardiovascular Disease

## 2021-01-15 DIAGNOSIS — I6523 Occlusion and stenosis of bilateral carotid arteries: Secondary | ICD-10-CM

## 2021-01-25 ENCOUNTER — Other Ambulatory Visit: Payer: Self-pay | Admitting: Cardiology

## 2021-01-25 DIAGNOSIS — E7800 Pure hypercholesterolemia, unspecified: Secondary | ICD-10-CM

## 2021-01-25 DIAGNOSIS — I2510 Atherosclerotic heart disease of native coronary artery without angina pectoris: Secondary | ICD-10-CM

## 2021-07-02 ENCOUNTER — Other Ambulatory Visit: Payer: Self-pay | Admitting: Cardiology

## 2021-07-02 NOTE — Telephone Encounter (Signed)
Prescription refill request for Eliquis received. Indication: Afib  Last office visit:08/28/20 (Turner)  Scr:0.89 (05/22/21)  Age: 80 Weight: 70.9kg  Appropriate dose and refill sent to requested pharmacy.

## 2021-08-28 ENCOUNTER — Encounter: Payer: Self-pay | Admitting: Cardiology

## 2021-08-28 ENCOUNTER — Ambulatory Visit: Payer: Medicare HMO | Admitting: Cardiology

## 2021-08-28 ENCOUNTER — Other Ambulatory Visit: Payer: Self-pay

## 2021-08-28 ENCOUNTER — Ambulatory Visit (INDEPENDENT_AMBULATORY_CARE_PROVIDER_SITE_OTHER): Payer: Medicare HMO

## 2021-08-28 VITALS — BP 138/66 | HR 48 | Ht 65.0 in | Wt 157.0 lb

## 2021-08-28 DIAGNOSIS — E7800 Pure hypercholesterolemia, unspecified: Secondary | ICD-10-CM

## 2021-08-28 DIAGNOSIS — I471 Supraventricular tachycardia: Secondary | ICD-10-CM

## 2021-08-28 DIAGNOSIS — I480 Paroxysmal atrial fibrillation: Secondary | ICD-10-CM | POA: Diagnosis not present

## 2021-08-28 DIAGNOSIS — I3139 Other pericardial effusion (noninflammatory): Secondary | ICD-10-CM

## 2021-08-28 DIAGNOSIS — I6523 Occlusion and stenosis of bilateral carotid arteries: Secondary | ICD-10-CM

## 2021-08-28 DIAGNOSIS — I10 Essential (primary) hypertension: Secondary | ICD-10-CM

## 2021-08-28 DIAGNOSIS — I2510 Atherosclerotic heart disease of native coronary artery without angina pectoris: Secondary | ICD-10-CM

## 2021-08-28 LAB — CBC
Hematocrit: 39.9 % (ref 34.0–46.6)
Hemoglobin: 13.2 g/dL (ref 11.1–15.9)
MCH: 27.8 pg (ref 26.6–33.0)
MCHC: 33.1 g/dL (ref 31.5–35.7)
MCV: 84 fL (ref 79–97)
Platelets: 231 10*3/uL (ref 150–450)
RBC: 4.75 x10E6/uL (ref 3.77–5.28)
RDW: 13.2 % (ref 11.7–15.4)
WBC: 8.3 10*3/uL (ref 3.4–10.8)

## 2021-08-28 LAB — COMPREHENSIVE METABOLIC PANEL
ALT: 19 IU/L (ref 0–32)
AST: 15 IU/L (ref 0–40)
Albumin/Globulin Ratio: 1.3 (ref 1.2–2.2)
Albumin: 4.2 g/dL (ref 3.7–4.7)
Alkaline Phosphatase: 101 IU/L (ref 44–121)
BUN/Creatinine Ratio: 21 (ref 12–28)
BUN: 18 mg/dL (ref 8–27)
Bilirubin Total: 0.7 mg/dL (ref 0.0–1.2)
CO2: 25 mmol/L (ref 20–29)
Calcium: 9.5 mg/dL (ref 8.7–10.3)
Chloride: 103 mmol/L (ref 96–106)
Creatinine, Ser: 0.86 mg/dL (ref 0.57–1.00)
Globulin, Total: 3.2 g/dL (ref 1.5–4.5)
Glucose: 157 mg/dL — ABNORMAL HIGH (ref 70–99)
Potassium: 5 mmol/L (ref 3.5–5.2)
Sodium: 141 mmol/L (ref 134–144)
Total Protein: 7.4 g/dL (ref 6.0–8.5)
eGFR: 69 mL/min/{1.73_m2} (ref >59)

## 2021-08-28 LAB — LIPID PANEL
Chol/HDL Ratio: 2.2 ratio (ref 0.0–4.4)
Cholesterol, Total: 140 mg/dL (ref 100–199)
HDL: 63 mg/dL (ref >39)
LDL Chol Calc (NIH): 64 mg/dL (ref 0–99)
Triglycerides: 63 mg/dL (ref 0–149)
VLDL Cholesterol Cal: 13 mg/dL (ref 5–40)

## 2021-08-28 MED ORDER — DILTIAZEM HCL 30 MG PO TABS: 30.0000 mg | ORAL_TABLET | Freq: Three times a day (TID) | ORAL | 3 refills | Status: DC

## 2021-08-28 NOTE — Progress Notes (Unsigned)
Enrolled for [employer] to mail a ZIO XT long term holter monitor to the patients address on file.  

## 2021-08-28 NOTE — Progress Notes (Signed)
Date:  08/28/2021   ID:  Brandi Frey, DOB [birth year], MRN [id number]   PCP:  [name], MD  Cardiologist:  Fransico Him, MD  Electrophysiologist:  None   Chief Complaint:  Followup CAD, HLD, HTN  History of Present Illness:    Brandi Frey is a 81 y.o. 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  She is here today for followup and is doing well.  She denies any chest pain or pressure, SOB, DOE, PND, orthopnea, LE edema (except in her ankle that she broke), dizziness, palpitations, exertional fatigue or syncope.  She is compliant with her meds and is tolerating meds with no SE.     Prior CV studies:   The following studies were reviewed today:  EKG  Past Medical History:  Diagnosis Date   Cancer of left breast ([place]) 08/2002   CAP (community acquired pneumonia) 09/18/2015   Carotid arterial disease ([place])    1-39% bilaterally by dopplers 11/2019   Coronary artery disease    a. 2009: DES x2 to LAD in [facility]   b. 07/2015: NSTEMI s/p DES to LAD.   Diabetes mellitus, type 2 ([place])    Hyperlipidemia    Hypertension    Mitral regurgitation    Mild by echo 2018   Myocardial infarction ([place]) 08/07/2015   "very light one"   Persistent atrial fibrillation ([place])    a. newly diagnosed 07/2015 in the setting of NSTEMI. Spont conv to NSR/SB. Started on Eliquis for CHADS2VASC score of 5   SVT (supraventricular tachycardia) ([place])    Past Surgical History:  Procedure Laterality Date   ABDOMINAL HYSTERECTOMY  1998   APPENDECTOMY  ~ [place] Left 08/2002   "needle biopsy"   BREAST LUMPECTOMY Left 08/2002   CARDIAC CATHETERIZATION N/A 08/07/2015   Procedure: Left Heart Cath and Coronary Angiography;  Surgeon: Burnell Blanks, MD;  Location: [place] [facility];  Service: Cardiovascular;  Laterality: N/A;   CARDIAC CATHETERIZATION N/A 08/07/2015    Procedure: Coronary Stent Intervention;  Surgeon: Burnell Blanks, MD;  Location: [place] [facility];  Service: Cardiovascular;  Laterality: N/A;  mid LAD   CORONARY ANGIOPLASTY WITH STENT PLACEMENT  2009   LAD x 2 with DES   DILATION AND CURETTAGE OF UTERUS  1980s X 1   "lots of bleeding & passing clots"   TUBAL LIGATION  1970     Current Meds  Medication Sig   atorvastatin (LIPITOR) 80 MG tablet TAKE 1 TABLET BY MOUTH EVERY DAY   brimonidine (ALPHAGAN) 0.2 % ophthalmic solution Place 1 drop into both eyes 2 (two) times daily.   diltiazem (CARDIZEM) 60 MG tablet TAKE 1 TABLET BY MOUTH THREE TIMES A DAY   ELIQUIS 5 MG TABS tablet TAKE 1 TABLET BY MOUTH TWICE A DAY   ezetimibe (ZETIA) 10 MG tablet Take 1 tablet (10 mg total) by mouth daily.   glipiZIDE (GLUCOTROL XL) 10 MG 24 hr tablet Take 20 mg by mouth every morning.   metoprolol succinate (TOPROL-XL) 25 MG 24 hr tablet TAKE 1/2 TABLET BY MOUTH EVERY DAY IN THE MORNING     Allergies:   Latex, Tape, and Metformin   Social History   Tobacco Use   Smoking status: Never   Smokeless tobacco: Never  Vaping Use   Vaping Use: Never used  Substance  Use Topics   Alcohol use: Yes    Comment: 09/18/2015 "might have a glass of wine a few times/year"   Drug use: No     Family Hx: The patient's family history includes CVA in her father; Diabetes in her brother; Heart attack in her brother; Heart disease in her brother; Hypertension in her sister; Ovarian cancer in her mother; Stroke in her father.  ROS:   Please see the history of present illness.     All other systems reviewed and are negative.   Labs/Other Tests and Data Reviewed:    Recent Labs: 08/28/2020: BUN 18; Creatinine, Ser 0.90; Potassium 5.3; Sodium 138 10/28/2020: ALT 18   Recent Lipid Panel Lab Results  Component Value Date/Time   CHOL 136 10/28/2020 09:24 AM   TRIG 73 10/28/2020 09:24 AM   HDL 61 10/28/2020 09:24 AM   CHOLHDL 2.2 10/28/2020 09:24 AM    CHOLHDL 2.7 09/20/2015 07:20 AM   LDLCALC 60 10/28/2020 09:24 AM    Wt Readings from Last 3 Encounters:  08/28/21 157 lb (71.2 kg)  08/28/20 156 lb 6.4 oz (70.9 kg)  02/29/20 157 lb (71.2 kg)     Objective:    Vital Signs:  BP 138/66    Pulse (!) 48    Ht 5\' 5"  (1.651 m)    Wt 157 lb (71.2 kg)    SpO2 99%    BMI 26.13 kg/m    GEN: Well nourished, well developed in no acute distress HEENT: Normal NECK: No JVD; No carotid bruits LYMPHATICS: No lymphadenopathy CARDIAC:RRR, no murmurs, rubs, gallops RESPIRATORY:  Clear to auscultation without rales, wheezing or rhonchi  ABDOMEN: Soft, non-tender, non-distended MUSCULOSKELETAL:  No edema; No deformity  SKIN: Warm and dry NEUROLOGIC:  Alert and oriented x 3 PSYCHIATRIC:  Normal affect    EKG was performed today and showed sinus bradycardia at 44bpm  ASSESSMENT & PLAN:    1.  ASCAD - s/p DESx2 to LAD in 2009 and DES to LAD in 07/2015. -s she has not had any anginal symptoms since I saw her last -She will continue prescription drug management with Toprol-XL 12.5 mg daily, atorvastatin 80 mg daily with as needed refills -She is not on aspirin due to DOAC  2.  Paroxysmal atrial fibrillation -She continues to maintain normal sinus rhythm on exam.  She denies any palpitations. -She has not had any bleeding problems on DOAC -Continue prescription drug management with Toprol-XL 12.5 mg daily and decrease Cardizem 30 mg 3 times daily (due to resting bradycardia) with as needed refills -She will also continue on apixaban 5 mg twice daily with as needed refills -Check Bmet and CBC today -she is bradycardic so I will get a 3 day ziopatch to assess HR after decreasing dose of Cardizem  3.  HTN -Her BP is adequately controlled on exam -Continue prescription drug management with Toprol-XL 12.5 mg daily with as needed refills -decreasing Cardizem to 30mg  TID due to bradycardia -check BP daily for a week and call with resulrs  4.   SVT -She has not had any further palpitations -Continue Cardizem and Toprol  5.  H/O pericardial effusion -remote hx of pleuropericarditis in 2016 -no effusion on echo 2018  6.  HLD -LDL goal < 70 -Repeat FLP and ALT -Continue prescription drug management with atorvastatin 80 mg daily and Zetia 10mg  daily with as needed refills  7.  Bilateral carotid artery stenosis -Dopplers 11/2019 showed 1-39% bilateral stenosis -repeat dopplers 11/2021 are pending in  April of this year -Continue atorvastatin  Medication Adjustments/Labs and Tests Ordered: Current medicines are reviewed at length with the patient today.  Concerns regarding medicines are outlined above.  Tests Ordered: Orders Placed This Encounter  Procedures   EKG 12-Lead   Medication Changes: No orders of the defined types were placed in this encounter.   Disposition:  Follow up in 1 year(s)  Signed, Fransico Him, MD  08/28/2021 9:46 AM    [facility]

## 2021-08-28 NOTE — Patient Instructions (Addendum)
Take your blood pressure daily for one week and send [place] a message with a list of your readings.   Medication Instructions:  Your physician has recommended you make the following change in your medication: 1) DECREASE your Cardizem (diltiazem) to 30 mg three times per day  *If you need a refill on your cardiac medications before your next appointment, please call your pharmacy*   Lab Work: TODAY: CMET, CBC, and FLP If you have labs (blood work) drawn today and your tests are completely normal, you will receive your results only by: [place] (if you have MyChart) OR A paper copy in the mail If you have any lab test that is abnormal or we need to change your treatment, we will call you to review the results.   Testing/Procedures: Your physician has recommended that you wear an event monitor. Event monitors are medical devices that record the hearts electrical activity. Doctors most often [place] these monitors to diagnose arrhythmias. Arrhythmias are problems with the speed or rhythm of the heartbeat. The monitor is a small, portable device. You can wear one while you do your normal daily activities. This is usually used to diagnose what is causing palpitations/syncope (passing out).  Follow-Up: At [facility], you and your health needs are our priority.  As part of our continuing mission to provide you with exceptional heart care, we have created designated Provider Care Teams.  These Care Teams include your primary Cardiologist (physician) and Advanced Practice Providers (APPs -  Physician Assistants and Nurse Practitioners) who all work together to provide you with the care you need, when you need it.  Your next appointment:   1 year(s)  The format for your next appointment:   In Person  Provider:   Fransico Him, MD     Other Instructions ZIO XT- Long Term Monitor Instructions  Your physician has requested you wear a ZIO patch monitor for 3 days.  This is a single patch  monitor. [employer] supplies one patch monitor per enrollment. Additional stickers are not available. Please do not apply patch if you will be having a Nuclear Stress Test,  Echocardiogram, Cardiac CT, MRI, or Chest Xray during the period you would be wearing the  monitor. The patch cannot be worn during these tests. You cannot remove and re-apply the  ZIO XT patch monitor.  Your ZIO patch monitor will be mailed 3 day USPS to your address on file. It may take 3-5 days  to receive your monitor after you have been enrolled.  Once you have received your monitor, please review the enclosed instructions. Your monitor  has already been registered assigning a specific monitor serial # to you.  Billing and Patient Assistance Program Information  We have supplied [employer] with any of your insurance information on file for billing purposes. [employer] offers a sliding scale Patient Assistance Program for patients that do not have  insurance, or whose insurance does not completely cover the cost of the ZIO monitor.  You must apply for the Patient Assistance Program to qualify for this discounted rate.  To apply, please call [employer] at [phone], select option 4, select option 2, ask to apply for  Patient Assistance Program. Theodore Demark will ask your household income, and how many people  are in your household. They will quote your out-of-pocket cost based on that information.  [employer] will also be able to set up a 1-month, interest-free payment plan if needed.  Applying the monitor   Shave hair from upper left  chest.  Hold abrader disc by orange tab. Rub abrader in 40 strokes over the upper left chest as  indicated in your monitor instructions.  Clean area with 4 enclosed alcohol pads. Let dry.  Apply patch as indicated in monitor instructions. Patch will be placed under collarbone on left  side of chest with arrow pointing upward.  Rub patch adhesive wings for 2 minutes. Remove white label marked "1".  Remove the white  label marked "2". Rub patch adhesive wings for 2 additional minutes.  While looking in a mirror, press and release button in center of patch. A small green light will  flash 3-4 times. This will be your only indicator that the monitor has been turned on.  Do not shower for the first 24 hours. You may shower after the first 24 hours.  Press the button if you feel a symptom. You will hear a small click. Record Date, Time and  Symptom in the Patient Logbook.  When you are ready to remove the patch, follow instructions on the last 2 pages of Patient  Logbook. Stick patch monitor onto the last page of Patient Logbook.  Place Patient Logbook in the blue and white box. Use locking tab on box and tape box closed  securely. The blue and white box has prepaid postage on it. Please place it in the mailbox as  soon as possible. Your physician should have your test results approximately 7 days after the  monitor has been mailed back to [facility].  Call [place] at [phone] if you have questions regarding  your ZIO XT patch monitor. Call them immediately if you see an orange light blinking on your  monitor.  If your monitor falls off in less than 4 days, contact our Monitor department at [phone].  If your monitor becomes loose or falls off after 4 days call [employer] at [phone] for  suggestions on securing your monitor

## 2021-08-28 NOTE — Addendum Note (Signed)
Addended by: Antonieta Iba on: 08/28/2021 09:58 AM   Modules accepted: Orders

## 2021-09-02 DIAGNOSIS — I480 Paroxysmal atrial fibrillation: Secondary | ICD-10-CM

## 2021-09-02 DIAGNOSIS — I471 Supraventricular tachycardia: Secondary | ICD-10-CM

## 2021-09-11 ENCOUNTER — Telehealth: Payer: Self-pay

## 2021-09-11 DIAGNOSIS — I2510 Atherosclerotic heart disease of native coronary artery without angina pectoris: Secondary | ICD-10-CM

## 2021-09-11 DIAGNOSIS — I480 Paroxysmal atrial fibrillation: Secondary | ICD-10-CM

## 2021-09-11 DIAGNOSIS — I471 Supraventricular tachycardia: Secondary | ICD-10-CM

## 2021-09-11 NOTE — Telephone Encounter (Signed)
-----   Message from Sueanne Margarita, MD sent at 09/11/2021 11:42 AM EST ----- Please get a 2D echo to assess LVF due to PVCs and wide complext tach

## 2021-09-11 NOTE — Telephone Encounter (Signed)
The patient has been notified of the result and verbalized understanding.  All questions (if any) were answered. Antonieta Iba, RN 09/11/2021 1:17 PM  Echocardiogram has been ordered.

## 2021-09-15 ENCOUNTER — Other Ambulatory Visit: Payer: Self-pay | Admitting: Cardiology

## 2021-09-17 ENCOUNTER — Other Ambulatory Visit: Payer: Self-pay

## 2021-09-17 ENCOUNTER — Ambulatory Visit (HOSPITAL_COMMUNITY): Payer: Medicare HMO | Attending: Cardiology

## 2021-09-17 DIAGNOSIS — I2510 Atherosclerotic heart disease of native coronary artery without angina pectoris: Secondary | ICD-10-CM | POA: Insufficient documentation

## 2021-09-17 DIAGNOSIS — I471 Supraventricular tachycardia: Secondary | ICD-10-CM | POA: Insufficient documentation

## 2021-09-17 DIAGNOSIS — I480 Paroxysmal atrial fibrillation: Secondary | ICD-10-CM | POA: Insufficient documentation

## 2021-09-17 LAB — ECHOCARDIOGRAM COMPLETE
Area-P 1/2: 2.39 cm2
S' Lateral: 3.1 cm

## 2021-09-18 ENCOUNTER — Encounter: Payer: Self-pay | Admitting: Cardiology

## 2021-09-18 ENCOUNTER — Telehealth: Payer: Self-pay

## 2021-09-18 DIAGNOSIS — R9430 Abnormal result of cardiovascular function study, unspecified: Secondary | ICD-10-CM

## 2021-09-18 NOTE — Telephone Encounter (Signed)
The patient has been notified of the result and verbalized understanding.  All questions (if any) were answered. Antonieta Iba, RN 09/18/2021 11:07 AM  Limited echo has been ordered.

## 2021-09-18 NOTE — Telephone Encounter (Signed)
-----   Message from Sueanne Margarita, MD sent at 09/18/2021 11:00 AM EST ----- Echo showed normal LV function with increase stiffness of the heart muscle normal for age, mild to moderately enlarged left and right atria, mild to moderate leakiness of the mitral valve.  Repeat echo in 1 year for MR. She does appear to have a new wall motion abnormality in the inferior base on some views.  Please get a limited echo with Definity contrast for further evaluation since this is a new finding

## 2021-09-30 ENCOUNTER — Encounter: Payer: Self-pay | Admitting: Cardiology

## 2021-09-30 DIAGNOSIS — I4819 Other persistent atrial fibrillation: Secondary | ICD-10-CM

## 2021-09-30 DIAGNOSIS — R0602 Shortness of breath: Secondary | ICD-10-CM

## 2021-09-30 DIAGNOSIS — I471 Supraventricular tachycardia: Secondary | ICD-10-CM

## 2021-09-30 NOTE — Addendum Note (Signed)
Addended by: Antonieta Iba on: 09/30/2021 03:39 PM   Modules accepted: Orders

## 2021-09-30 NOTE — Telephone Encounter (Signed)
Brandi Margarita, MD  09/18/2021 11:00 AM EST     Echo showed normal LV function with increase stiffness of the heart muscle normal for age, mild to moderately enlarged left and right atria, mild to moderate leakiness of the mitral valve.  Repeat echo in 1 year for MR. She does appear to have a new wall motion abnormality in the inferior base on some views.  Please get a limited echo with Definity contrast for further evaluation since this is a new finding   It appears that Dr Radford Pax wanted a repeat echo in 1 year for MR, which is her usual testing to follow this disease process. Dr Radford Pax also wanted a limited ECHO now to clarify the new findings of wall motion abnormality. It appears that the order was placed, but has not been scheduled. Will contact Edmon Crape to get this scheduled

## 2021-09-30 NOTE — Telephone Encounter (Signed)
Orders placed.

## 2021-10-06 ENCOUNTER — Telehealth: Payer: Self-pay | Admitting: Cardiology

## 2021-10-06 NOTE — Telephone Encounter (Signed)
Spoke with patient's daughter Judeen Hammans and informed her to hold patient's Cardizem and Toprol per [name]. Also to go to ER if symptoms worsen. Patient to follow-up with Afib clinic tomorrow (10/07/21).  Judeen Hammans verbalized understanding and agrees with plan as stated above.

## 2021-10-06 NOTE — Telephone Encounter (Signed)
Left detailed message for EC to call back to office.  Advised of below.  Advised holding an afib clinic spot for tomorrow at 9;30 am 10/07/2021.  Requested call back to confirm.   Per [name]:  Stop Cardizem and Toprol.  If sx worsen needs to go to ER.  Followup in afib clinic tomorrow

## 2021-10-06 NOTE — Telephone Encounter (Signed)
Pt c/o BP issue: STAT if pt c/o blurred vision, one-sided weakness or slurred speech  1. What are your last 5 BP readings? 96/66; 105/86; 101/57; 131/69  2. Are you having any other symptoms (ex. Dizziness, headache, blurred vision, passed out)? Dizziness, sob, lightheadedness.   3. What is your BP issue? Low. Blood sugar was 376. Tingling in her fingers, patient had fell Saturday night and broke he nose

## 2021-10-06 NOTE — Telephone Encounter (Signed)
Call transferred to triage from scheduling.  Pt's daughter calling because Pt contacted her this morning complaining of not feeling well.  Per daughter Pt's blood pressures were -96/66, 105/86, 101/57 and finally 131/69.  Daughter states Pt's pulse has been steadily 46-48. Confirmed Pt taking Toprol 12.5 mg daily/diltiazem 30 mg TID.  They believed her blood sugar was low and so gave Pt a few sugary snacks before checking blood sugar, which was then 376.  Will forward to [name] for advisement.  Pt has repeat limited echo scheduled for 10/14/2021.  Recently wore HM.

## 2021-10-07 ENCOUNTER — Encounter (HOSPITAL_COMMUNITY): Payer: Self-pay | Admitting: Physician Assistant

## 2021-10-07 ENCOUNTER — Ambulatory Visit (HOSPITAL_COMMUNITY)
Admission: RE | Admit: 2021-10-07 | Discharge: 2021-10-07 | Disposition: A | Discharge status: Home / Self Care | Payer: Medicare HMO | Source: Ambulatory Visit | Attending: Physician Assistant | Admitting: Physician Assistant

## 2021-10-07 ENCOUNTER — Other Ambulatory Visit: Payer: Self-pay

## 2021-10-07 VITALS — BP 160/70 | HR 56 | Ht 65.0 in | Wt 156.6 lb

## 2021-10-07 DIAGNOSIS — I2510 Atherosclerotic heart disease of native coronary artery without angina pectoris: Secondary | ICD-10-CM | POA: Insufficient documentation

## 2021-10-07 DIAGNOSIS — E119 Type 2 diabetes mellitus without complications: Secondary | ICD-10-CM | POA: Insufficient documentation

## 2021-10-07 DIAGNOSIS — I480 Paroxysmal atrial fibrillation: Secondary | ICD-10-CM | POA: Insufficient documentation

## 2021-10-07 DIAGNOSIS — D6869 Other thrombophilia: Secondary | ICD-10-CM | POA: Diagnosis not present

## 2021-10-07 DIAGNOSIS — E785 Hyperlipidemia, unspecified: Secondary | ICD-10-CM | POA: Insufficient documentation

## 2021-10-07 DIAGNOSIS — I471 Supraventricular tachycardia: Secondary | ICD-10-CM | POA: Insufficient documentation

## 2021-10-07 DIAGNOSIS — I519 Heart disease, unspecified: Secondary | ICD-10-CM | POA: Diagnosis not present

## 2021-10-07 DIAGNOSIS — Z7901 Long term (current) use of anticoagulants: Secondary | ICD-10-CM | POA: Diagnosis not present

## 2021-10-07 DIAGNOSIS — I10 Essential (primary) hypertension: Secondary | ICD-10-CM | POA: Insufficient documentation

## 2021-10-07 MED ORDER — DILTIAZEM HCL 30 MG PO TABS: ORAL_TABLET | ORAL | 3 refills | Status: DC

## 2021-10-07 NOTE — Progress Notes (Signed)
Primary Care Physician: [name], MD Primary Cardiologist: Dr Radford Pax Primary Electrophysiologist: none Referring Physician: Dr Maryan Rued is a 81 y.o. female with a history of CAD, HTN, SVT, HLD, carotid artery stenosis, DM, atrial fibrillation who presents for consultation in the [facility]. Patient was diagnosed with afib remotely. She had been maintained on metoprolol and diltiazem but had recently had episodes of bradycardia and these were discontinued. She had worn a cardiac monitor 09/11/21 which showed avg HR 59 with no significant pauses, no afib. Patient is on Eliquis for a CHADS2VASC score of 6. Today, patient reports that her symptoms of lightheadedness have resolved off the medication. She does have occasional palpitations but these last only a few seconds and are not very bothersome.   Today, she denies symptoms of chest pain, shortness of breath, orthopnea, PND, lower extremity edema, dizziness, presyncope, syncope, snoring, daytime somnolence, bleeding, or neurologic sequela. The patient is tolerating medications without difficulties and is otherwise without complaint today.    Atrial Fibrillation Risk Factors:  she does not have symptoms or diagnosis of sleep apnea. she does not have a history of rheumatic fever.   she has a BMI of Body mass index is 26.06 kg/m.Marland Kitchen Filed Weights   10/07/21 0928  Weight: 71 kg    Family History  Problem Relation Age of Onset   Ovarian cancer Mother    CVA Father    Stroke Father    Heart disease Brother    Heart attack Brother    Hypertension Sister    Diabetes Brother      Atrial Fibrillation Management history:  Previous antiarrhythmic drugs: none Previous cardioversions: none Previous ablations: none CHADS2VASC score: 6 Anticoagulation history: Eliquis   Past Medical History:  Diagnosis Date   Cancer of left breast ([place]) 08/2002   CAP (community acquired  pneumonia) 09/18/2015   Carotid arterial disease ([place])    1-39% bilaterally by dopplers 11/2019   Coronary artery disease    a. 2009: DES x2 to LAD in [facility]   b. 07/2015: NSTEMI s/p DES to LAD.   Diabetes mellitus, type 2 ([place])    Hyperlipidemia    Hypertension    Mitral regurgitation    Mild to moderate by echo 09/2021   Myocardial infarction [facility]) 08/07/2015   "very light one"   Persistent atrial fibrillation ([place])    a. newly diagnosed 07/2015 in the setting of NSTEMI. Spont conv to NSR/SB. Started on Eliquis for CHADS2VASC score of 5   SVT (supraventricular tachycardia) ([place])    Past Surgical History:  Procedure Laterality Date   ABDOMINAL HYSTERECTOMY  1998   APPENDECTOMY  ~ [place] Left 08/2002   "needle biopsy"   BREAST LUMPECTOMY Left 08/2002   CARDIAC CATHETERIZATION N/A 08/07/2015   Procedure: Left Heart Cath and Coronary Angiography;  Surgeon: Burnell Blanks, MD;  Location: [place] [facility];  Service: Cardiovascular;  Laterality: N/A;   CARDIAC CATHETERIZATION N/A 08/07/2015   Procedure: Coronary Stent Intervention;  Surgeon: Burnell Blanks, MD;  Location: [place] [facility];  Service: Cardiovascular;  Laterality: N/A;  mid LAD   CORONARY ANGIOPLASTY WITH STENT PLACEMENT  2009   LAD x 2 with DES   DILATION AND CURETTAGE OF UTERUS  1980s X 1   "lots of bleeding & passing clots"   TUBAL LIGATION  1970    Current Outpatient Medications  Medication Sig Dispense Refill   atorvastatin (LIPITOR) 80  MG tablet TAKE 1 TABLET BY MOUTH EVERY DAY 90 tablet 3   brimonidine (ALPHAGAN) 0.2 % ophthalmic solution Place 1 drop into both eyes 2 (two) times daily.     ELIQUIS 5 MG TABS tablet TAKE 1 TABLET BY MOUTH TWICE A DAY 60 tablet 4   glipiZIDE (GLUCOTROL XL) 10 MG 24 hr tablet Take 20 mg by mouth every morning.     Lancets (ONETOUCH DELICA PLUS [id number]) MISC      ONETOUCH VERIO test strip 1 each 2 (two) times daily.     diltiazem (CARDIZEM) 30 MG  tablet Take 1 tablet (30 mg total) by mouth 3 (three) times daily. (Patient not taking: Reported on 10/07/2021) 270 tablet 3   metoprolol succinate (TOPROL-XL) 25 MG 24 hr tablet TAKE 1/2 TABLET BY MOUTH EVERY DAY IN THE MORNING (Patient not taking: Reported on 10/07/2021) 45 tablet 3   No current facility-administered medications for this encounter.    Allergies  Allergen Reactions   Latex Other (See Comments) and Rash    BLISTERING   Tape Other (See Comments) and Rash    BLISTERING   Metformin Diarrhea    Can take but bothers stomach at time    Social History   Socioeconomic History   Marital status: Divorced    Spouse name: Not on file   Number of children: Not on file   Years of education: Not on file   Highest education level: Not on file  Occupational History   Not on file  Tobacco Use   Smoking status: Never   Smokeless tobacco: Never  Vaping Use   Vaping Use: Never used  Substance and Sexual Activity   Alcohol use: Yes    Comment: 09/18/2015 "might have a glass of wine a few times/year"   Drug use: No   Sexual activity: Not Currently  Other Topics Concern   Not on file  Social History Narrative   Not on file   Social Determinants of Health   Financial Resource Strain: Not on file  Food Insecurity: Not on file  Transportation Needs: Not on file  Physical Activity: Not on file  Stress: Not on file  Social Connections: Not on file  Intimate Partner Violence: Not on file     ROS- All systems are reviewed and negative except as per the HPI above.  Physical Exam: Vitals:   10/07/21 0928  BP: (!) 160/70  Pulse: (!) 56  Weight: 71 kg  Height: 5\' 5"  (1.651 m)    GEN- The patient is a well appearing elderly female, alert and oriented x 3 today.   Head- normocephalic, bandaged nose Eyes-  Sclera clear, conjunctiva pink Ears- hearing intact Oropharynx- clear Neck- supple  Lungs- Clear to ausculation bilaterally, normal work of breathing Heart- Regular  rate and rhythm, no murmurs, rubs or gallops  GI- soft, NT, ND, + BS Extremities- no clubbing, cyanosis, or edema MS- no significant deformity or atrophy Skin- no rash or lesion Psych- euthymic mood, full affect Neuro- strength and sensation are intact  Wt Readings from Last 3 Encounters:  10/07/21 71 kg  08/28/21 71.2 kg  08/28/20 70.9 kg    EKG today demonstrates  SB Vent. rate 56 BPM PR interval 134 ms QRS duration 86 ms QT/QTcB 458/441 ms  Echo 09/17/21 demonstrated   1. Left ventricular ejection fraction, by estimation, is 55 to 60%. The  left ventricle has normal function. The left ventricle demonstrates  regional wall motion abnormalities with basal inferior severe  hypokinesis. Left ventricular diastolic parameters are consistent with Grade I diastolic dysfunction (impaired relaxation).   2. Right ventricular systolic function is normal. The right ventricular  size is normal. There is normal pulmonary artery systolic pressure. The estimated right ventricular systolic pressure is 26.8 mmHg.   3. Left atrial size was mild to moderately dilated.   4. Right atrial size was mildly dilated.   5. The mitral valve is normal in structure. Mild to moderate mitral valve regurgitation. No evidence of mitral stenosis.   6. The aortic valve is tricuspid. Aortic valve regurgitation is not  visualized. No aortic stenosis is present.   7. The inferior vena cava is normal in size with greater than 50%  respiratory variability, suggesting right atrial pressure of 3 mmHg.   Epic records are reviewed at length today  CHA2DS2-VASc Score = 6  The patient's score is based upon: CHF History: 0 HTN History: 1 Diabetes History: 1 Stroke History: 0 Vascular Disease History: 1 Age Score: 2 Gender Score: 1       ASSESSMENT AND PLAN: 1. Paroxysmal Atrial Fibrillation/atrial tachycardia The patient's CHA2DS2-VASc score is 6, indicating a 9.7% annual risk of stroke.   Agree with stopping AV  nodal agents given symptomatic bradycardia. Overall burden is low. Will change diltiazem to 30 mg PRN q 4 hours for heart racing. Continue Eliquis 5 mg BID  2. Secondary Hypercoagulable State (ICD10:  D68.69) The patient is at significant risk for stroke/thromboembolism based upon her CHA2DS2-VASc Score of 6.  Continue Apixaban (Eliquis).   3. Valvular heart disease Mild-moderate MR  4. HTN Elevated today, patient to keep BP log now that she is off BB and CCB.  5. CAD S/p DES x 3  No anginal symptoms.   Follow up with Dr Radford Pax as scheduled. AF clinic as needed.    [facility] [address] [place], [place] [address] [phone] 10/07/2021 9:36 AM

## 2021-10-14 ENCOUNTER — Telehealth: Payer: Self-pay

## 2021-10-14 ENCOUNTER — Encounter: Payer: Self-pay | Admitting: Cardiology

## 2021-10-14 ENCOUNTER — Ambulatory Visit (HOSPITAL_COMMUNITY): Payer: Medicare HMO | Attending: Cardiovascular Disease

## 2021-10-14 ENCOUNTER — Other Ambulatory Visit: Payer: Self-pay

## 2021-10-14 DIAGNOSIS — R9430 Abnormal result of cardiovascular function study, unspecified: Secondary | ICD-10-CM

## 2021-10-14 DIAGNOSIS — R0602 Shortness of breath: Secondary | ICD-10-CM

## 2021-10-14 DIAGNOSIS — I480 Paroxysmal atrial fibrillation: Secondary | ICD-10-CM

## 2021-10-14 DIAGNOSIS — I471 Supraventricular tachycardia: Secondary | ICD-10-CM

## 2021-10-14 LAB — ECHOCARDIOGRAM LIMITED
Area-P 1/2: 2.74 cm2
S' Lateral: 2.8 cm

## 2021-10-14 MED ORDER — PERFLUTREN LIPID MICROSPHERE
2.0000 mL | INTRAVENOUS | Status: AC | PRN
  Administered 2021-10-14: 2 mL via INTRAVENOUS

## 2021-10-14 NOTE — Telephone Encounter (Signed)
The patient has been notified of the result and verbalized understanding.  All questions (if any) were answered. ?Antonieta Iba, RN 10/14/2021 3:57 PM  ? ?

## 2021-10-14 NOTE — Telephone Encounter (Signed)
-----   Message from Sueanne Margarita, MD sent at 10/14/2021  3:19 PM EST ----- ?Echo showed normal heart function and mild to moderate MR - repeat echo in 1 year for MR ?

## 2021-10-21 ENCOUNTER — Encounter: Payer: Self-pay | Admitting: Cardiology

## 2021-10-21 NOTE — Telephone Encounter (Signed)
Spoke with the patient's daughter who reports that the patient has continued to remain off of cardizem and toprol. They  have been monitoring her blood pressure. She states that last night her pressure was 140/76 and at her PCP today it was 124/70. She has been monitoring it daily but the daughter is not with the patient right now. She is going to go by and get her list of readings and send them to [place] through MyChart for review to see if patient can continue to remain off of her BP medications. Daughter also reports that her HR is between 62-70. Advised that once we get patient's BP readings and Dr Radford Pax reviews them I will let her know if any changes need to be made.  ?

## 2021-10-21 NOTE — Telephone Encounter (Signed)
Pt c/o BP issue: STAT if pt c/o blurred vision, one-sided weakness or slurred speech ? ?1. What are your last 5 BP readings?  ? ?2. Are you having any other symptoms (ex. Dizziness, headache, blurred vision, passed out)?  ?No  ? ?3. What is your BP issue?  ? ?Patient's daughter is following up to provide an updated BP reading. States the patient went to see her PCP today and BP/HR were 125/70 64. Patient is not having any symptoms. ? ?

## 2021-11-18 ENCOUNTER — Ambulatory Visit (HOSPITAL_COMMUNITY)
Admission: RE | Admit: 2021-11-18 | Discharge: 2021-11-18 | Disposition: A | Discharge status: Home / Self Care | Payer: Medicare HMO | Source: Ambulatory Visit | Attending: Internal Medicine | Admitting: Internal Medicine

## 2021-11-18 DIAGNOSIS — I6523 Occlusion and stenosis of bilateral carotid arteries: Secondary | ICD-10-CM | POA: Diagnosis present

## 2021-11-20 ENCOUNTER — Encounter: Payer: Self-pay | Admitting: Cardiology

## 2021-12-03 ENCOUNTER — Other Ambulatory Visit: Payer: Self-pay | Admitting: Cardiology

## 2021-12-03 DIAGNOSIS — I4819 Other persistent atrial fibrillation: Secondary | ICD-10-CM

## 2021-12-03 NOTE — Telephone Encounter (Signed)
Prescription refill request for Eliquis received. ?Indication:Afib  ?Last office visit: 10/07/21 Marlene Lard) ?Scr: 0.86 (08/28/21) ?Age: 81 ?Weight: 71kg ? ?Appropriate dose and refill sent to requested pharmacy  ?

## 2021-12-29 ENCOUNTER — Other Ambulatory Visit (HOSPITAL_COMMUNITY): Payer: Self-pay | Admitting: Cardiology

## 2021-12-29 DIAGNOSIS — I6523 Occlusion and stenosis of bilateral carotid arteries: Secondary | ICD-10-CM

## 2022-01-16 ENCOUNTER — Other Ambulatory Visit: Payer: Self-pay | Admitting: Cardiology

## 2022-01-16 DIAGNOSIS — I2510 Atherosclerotic heart disease of native coronary artery without angina pectoris: Secondary | ICD-10-CM

## 2022-01-16 DIAGNOSIS — E7800 Pure hypercholesterolemia, unspecified: Secondary | ICD-10-CM

## 2022-05-05 ENCOUNTER — Other Ambulatory Visit: Payer: Self-pay | Admitting: Cardiology

## 2022-05-05 DIAGNOSIS — I4819 Other persistent atrial fibrillation: Secondary | ICD-10-CM

## 2022-05-05 NOTE — Telephone Encounter (Signed)
Prescription refill request for Eliquis received. Indication:Afib Last office visit:1/23 Scr:0.8 Age: 81 Weight:71 kg  Prescription refilled

## 2022-07-02 ENCOUNTER — Other Ambulatory Visit: Payer: Self-pay | Admitting: Cardiology

## 2022-07-02 DIAGNOSIS — I2510 Atherosclerotic heart disease of native coronary artery without angina pectoris: Secondary | ICD-10-CM

## 2022-07-02 DIAGNOSIS — E7800 Pure hypercholesterolemia, unspecified: Secondary | ICD-10-CM

## 2022-08-26 ENCOUNTER — Ambulatory Visit: Payer: Medicare HMO | Attending: Cardiology | Admitting: Cardiology

## 2022-08-26 ENCOUNTER — Encounter: Payer: Self-pay | Admitting: Cardiology

## 2022-08-26 ENCOUNTER — Other Ambulatory Visit: Payer: Self-pay

## 2022-08-26 VITALS — BP 138/74 | HR 72 | Ht 65.0 in | Wt 156.6 lb

## 2022-08-26 DIAGNOSIS — I3139 Other pericardial effusion (noninflammatory): Secondary | ICD-10-CM

## 2022-08-26 DIAGNOSIS — I2510 Atherosclerotic heart disease of native coronary artery without angina pectoris: Secondary | ICD-10-CM

## 2022-08-26 DIAGNOSIS — E7800 Pure hypercholesterolemia, unspecified: Secondary | ICD-10-CM

## 2022-08-26 DIAGNOSIS — I4891 Unspecified atrial fibrillation: Secondary | ICD-10-CM

## 2022-08-26 DIAGNOSIS — I471 Supraventricular tachycardia, unspecified: Secondary | ICD-10-CM | POA: Diagnosis not present

## 2022-08-26 DIAGNOSIS — I10 Essential (primary) hypertension: Secondary | ICD-10-CM

## 2022-08-26 DIAGNOSIS — I6523 Occlusion and stenosis of bilateral carotid arteries: Secondary | ICD-10-CM

## 2022-08-26 NOTE — Addendum Note (Signed)
Addended by: Joni Reining on: 08/26/2022 02:30 PM   Modules accepted: Orders

## 2022-08-26 NOTE — Patient Instructions (Signed)
Medication Instructions:  Your physician recommends that you continue on your current medications as directed. Please refer to the Current Medication list given to you today.  *If you need a refill on your cardiac medications before your next appointment, please call your pharmacy*   Lab Work: Please complete a CMET, CBC, and Fasting lipid panel at the lab of your choice. If you have labs (blood work) drawn today and your tests are completely normal, you will receive your results only by: [place] (if you have MyChart) OR A paper copy in the mail If you have any lab test that is abnormal or we need to change your treatment, we will call you to review the results.   Testing/Procedures: None.   Follow-Up: At [facility], you and your health needs are our priority.  As part of our continuing mission to provide you with exceptional heart care, we have created designated Provider Care Teams.  These Care Teams include your primary Cardiologist (physician) and Advanced Practice Providers (APPs -  Physician Assistants and Nurse Practitioners) who all work together to provide you with the care you need, when you need it.  We recommend signing up for the patient portal called "MyChart".  Sign up information is provided on this After Visit Summary.  MyChart is used to connect with patients for Virtual Visits (Telemedicine).  Patients are able to view lab/test results, encounter notes, upcoming appointments, etc.  Non-urgent messages can be sent to your provider as well.   To learn more about what you can do with MyChart, go to [email or web address].    Your next appointment:   1 year(s)  Provider:   Fransico Him, MD

## 2022-08-26 NOTE — Progress Notes (Signed)
Date:  08/26/2022   ID:  Brandi Frey, DOB May 07, 1941, MRN [id number]   PCP:  [name], MD  Cardiologist:  Fransico Him, MD  Electrophysiologist:  None   Chief Complaint:  Followup CAD, HLD, HTN  History of Present Illness:    Brandi Frey is a 82 y.o. 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  She denies any chest pain or pressure, SOB, DOE, PND, orthopnea, LE edema, dizziness, palpitations or syncope. She is compliant with her meds and is tolerating meds with no SE.    Prior CV studies:   The following studies were reviewed today:  None  Past Medical History:  Diagnosis Date   Cancer of left breast ([place]) 08/2002   CAP (community acquired pneumonia) 09/18/2015   Carotid arterial disease ([place])    1-39% bilaterally by dopplers 11/2021   Coronary artery disease    a. 2009: DES x2 to LAD in [facility]   b. 07/2015: NSTEMI s/p DES to LAD.   Diabetes mellitus, type 2 ([place])    Hyperlipidemia    Hypertension    Mitral regurgitation    Mild to moderate by echo 09/2021   Myocardial infarction [facility]) 08/07/2015   "very light one"   Persistent atrial fibrillation ([place])    a. newly diagnosed 07/2015 in the setting of NSTEMI. Spont conv to NSR/SB. Started on Eliquis for CHADS2VASC score of 5   SVT (supraventricular tachycardia)    Past Surgical History:  Procedure Laterality Date   ABDOMINAL HYSTERECTOMY  1998   APPENDECTOMY  ~ [place] Left 08/2002   "needle biopsy"   BREAST LUMPECTOMY Left 08/2002   CARDIAC CATHETERIZATION N/A 08/07/2015   Procedure: Left Heart Cath and Coronary Angiography;  Surgeon: Burnell Blanks, MD;  Location: [place] [facility];  Service: Cardiovascular;  Laterality: N/A;   CARDIAC CATHETERIZATION N/A 08/07/2015   Procedure: Coronary Stent Intervention;   Surgeon: Burnell Blanks, MD;  Location: [place] [facility];  Service: Cardiovascular;  Laterality: N/A;  mid LAD   CORONARY ANGIOPLASTY WITH STENT PLACEMENT  2009   LAD x 2 with DES   DILATION AND CURETTAGE OF UTERUS  1980s X 1   "lots of bleeding & passing clots"   TUBAL LIGATION  1970     Current Meds  Medication Sig   apixaban (ELIQUIS) 5 MG TABS tablet TAKE 1 TABLET BY MOUTH TWICE A DAY   atorvastatin (LIPITOR) 80 MG tablet TAKE 1 TABLET BY MOUTH EVERY DAY   BLACK ELDERBERRY PO Take by mouth daily. Gummy   brimonidine (ALPHAGAN) 0.2 % ophthalmic solution Place 1 drop into both eyes 2 (two) times daily.   FIBER PO Take by mouth daily.   glipiZIDE (GLUCOTROL XL) 10 MG 24 hr tablet Take 20 mg by mouth every morning.   Lancets (ONETOUCH DELICA PLUS [id number]) MISC    ONETOUCH VERIO test strip 1 each 2 (two) times daily.   [DISCONTINUED] diltiazem (CARDIZEM) 30 MG tablet Take 1 tablet every 4 hours AS NEEDED for heart rate >100     Allergies:   Latex, Tape, and Metformin   Social History   Tobacco Use   Smoking status: Never   Smokeless tobacco: Never  Vaping Use   Vaping Use: Never used  Substance Use Topics  Alcohol use: Yes    Comment: 09/18/2015 "might have a glass of wine a few times/year"   Drug use: No     Family Hx: The patient's family history includes CVA in her father; Diabetes in her brother; Heart attack in her brother; Heart disease in her brother; Hypertension in her sister; Ovarian cancer in her mother; Stroke in her father.  ROS:   Please see the history of present illness.     All other systems reviewed and are negative.   Labs/Other Tests and Data Reviewed:    Recent Labs: 08/28/2021: ALT 19; BUN 18; Creatinine, Ser 0.86; Hemoglobin 13.2; Platelets 231; Potassium 5.0; Sodium 141   Recent Lipid Panel Lab Results  Component Value Date/Time   CHOL 140 08/28/2021 10:03 AM   TRIG 63 08/28/2021 10:03 AM   HDL 63 08/28/2021 10:03 AM   CHOLHDL  2.2 08/28/2021 10:03 AM   CHOLHDL 2.7 09/20/2015 07:20 AM   LDLCALC 64 08/28/2021 10:03 AM    Wt Readings from Last 3 Encounters:  08/26/22 156 lb 9.6 oz (71 kg)  10/07/21 156 lb 9.6 oz (71 kg)  08/28/21 157 lb (71.2 kg)     Objective:    Vital Signs:  BP 138/74   Pulse 72   Ht '5\' 5"'$  (1.651 m)   Wt 156 lb 9.6 oz (71 kg)   SpO2 97%   BMI 26.06 kg/m    GEN: Well nourished, well developed in no acute distress HEENT: Normal NECK: No JVD; No carotid bruits LYMPHATICS: No lymphadenopathy CARDIAC:RRR, no murmurs, rubs, gallops RESPIRATORY:  Clear to auscultation without rales, wheezing or rhonchi  ABDOMEN: Soft, non-tender, non-distended MUSCULOSKELETAL:  No edema; No deformity  SKIN: Warm and dry NEUROLOGIC:  Alert and oriented x 3 PSYCHIATRIC:  Normal affect   EKG was not performed today  ASSESSMENT & PLAN:    1.  ASCAD -s/p DESx2 to LAD in 2009 and DES to LAD in 07/2015. -She denies any anginal symptoms since I saw her last -Continue prescription drug Toprol-XL 12.5 mg daily, atorvastatin 80 mg daily with as needed refills -No ASA due to DOAC  2.  Paroxysmal atrial fibrillation -She is maintaining normal sinus rhythm.  She denies any palpitations or bleeding problems on DOAC -Continue prescription drug management with Eliquis 5 mg twice daily with as needed refills -She is not on AV nodal blocking agents due to bradycardia -check CBC and BMET today  3.  HTN -BP is controlled on exam today -She has not required any antihypertensive therapy recently  4.  SVT -She has not had any further -Her Cardizem and beta-blocker were stopped due to bradycardia  5.  H/O pericardial effusion -remote hx of pleuropericarditis in 2016 -no effusion on echo 2018  6.  HLD -LDL goal < 70 -Check FLP and ALT -Continue prescription drug management with atorvastatin 80 mg daily  with as needed refills  7.  Bilateral carotid artery stenosis -Dopplers 11/2021 showed 1-39% bilateral  stenosis -repeat dopplers 11/2023  -Continue statin therapy  -No aspirin due to DOAC  Medication Adjustments/Labs and Tests Ordered: Current medicines are reviewed at length with the patient today.  Concerns regarding medicines are outlined above.  Tests Ordered: No orders of the defined types were placed in this encounter.  Medication Changes: No orders of the defined types were placed in this encounter.   Disposition:  Follow up in 1 year(s)  Signed, Fransico Him, MD  08/26/2022 2:07 PM    [facility]

## 2022-08-29 LAB — LIPID PANEL
Chol/HDL Ratio: 2.3 ratio (ref 0.0–4.4)
Cholesterol, Total: 132 mg/dL (ref 100–199)
HDL: 58 mg/dL (ref >39)
LDL Chol Calc (NIH): 61 mg/dL (ref 0–99)
Triglycerides: 59 mg/dL (ref 0–149)
VLDL Cholesterol Cal: 13 mg/dL (ref 5–40)

## 2022-08-29 LAB — CBC WITH DIFFERENTIAL/PLATELET
Basophils Absolute: 0.1 10*3/uL (ref 0.0–0.2)
Basos: 1 %
EOS (ABSOLUTE): 0.2 10*3/uL (ref 0.0–0.4)
Eos: 3 %
Hematocrit: 39.2 % (ref 34.0–46.6)
Hemoglobin: 12.9 g/dL (ref 11.1–15.9)
Immature Grans (Abs): 0 10*3/uL (ref 0.0–0.1)
Immature Granulocytes: 0 %
Lymphocytes Absolute: 1.2 10*3/uL (ref 0.7–3.1)
Lymphs: 18 %
MCH: 27.9 pg (ref 26.6–33.0)
MCHC: 32.9 g/dL (ref 31.5–35.7)
MCV: 85 fL (ref 79–97)
Monocytes Absolute: 0.5 10*3/uL (ref 0.1–0.9)
Monocytes: 7 %
Neutrophils Absolute: 4.9 10*3/uL (ref 1.4–7.0)
Neutrophils: 71 %
Platelets: 220 10*3/uL (ref 150–450)
RBC: 4.62 x10E6/uL (ref 3.77–5.28)
RDW: 12.8 % (ref 11.7–15.4)
WBC: 7 10*3/uL (ref 3.4–10.8)

## 2022-08-29 LAB — COMPREHENSIVE METABOLIC PANEL
ALT: 18 IU/L (ref 0–32)
AST: 17 IU/L (ref 0–40)
Albumin/Globulin Ratio: 1.3 (ref 1.2–2.2)
Albumin: 4 g/dL (ref 3.8–4.8)
Alkaline Phosphatase: 98 IU/L (ref 44–121)
BUN/Creatinine Ratio: 15 (ref 12–28)
BUN: 12 mg/dL (ref 8–27)
Bilirubin Total: 0.7 mg/dL (ref 0.0–1.2)
CO2: 25 mmol/L (ref 20–29)
Calcium: 9.4 mg/dL (ref 8.7–10.3)
Chloride: 104 mmol/L (ref 96–106)
Creatinine, Ser: 0.79 mg/dL (ref 0.57–1.00)
Globulin, Total: 3.2 g/dL (ref 1.5–4.5)
Glucose: 168 mg/dL — ABNORMAL HIGH (ref 70–99)
Potassium: 4.9 mmol/L (ref 3.5–5.2)
Sodium: 140 mmol/L (ref 134–144)
Total Protein: 7.2 g/dL (ref 6.0–8.5)
eGFR: 76 mL/min/{1.73_m2} (ref >59)

## 2022-08-31 ENCOUNTER — Telehealth: Payer: Self-pay | Admitting: Cardiology

## 2022-08-31 NOTE — Telephone Encounter (Signed)
-----  Message from Sueanne Margarita, MD sent at 08/30/2022  4:27 PM EST ----- Please let patient know that labs were normal.  Continue current medical therapy.

## 2022-08-31 NOTE — Telephone Encounter (Signed)
Normal lab results reviewed with patient who verbalized understanding.

## 2022-08-31 NOTE — Telephone Encounter (Signed)
Patient was returning phone call 

## 2022-10-08 ENCOUNTER — Ambulatory Visit (HOSPITAL_COMMUNITY): Payer: Medicare HMO | Attending: Cardiology

## 2022-10-08 DIAGNOSIS — R9430 Abnormal result of cardiovascular function study, unspecified: Secondary | ICD-10-CM | POA: Diagnosis present

## 2022-10-08 DIAGNOSIS — I471 Supraventricular tachycardia, unspecified: Secondary | ICD-10-CM

## 2022-10-08 DIAGNOSIS — R0602 Shortness of breath: Secondary | ICD-10-CM | POA: Diagnosis present

## 2022-10-08 LAB — ECHOCARDIOGRAM COMPLETE
Area-P 1/2: 2.45 cm2
S' Lateral: 2.9 cm

## 2022-10-20 ENCOUNTER — Other Ambulatory Visit: Payer: Self-pay | Admitting: Cardiology

## 2022-10-20 DIAGNOSIS — I2510 Atherosclerotic heart disease of native coronary artery without angina pectoris: Secondary | ICD-10-CM

## 2022-10-20 DIAGNOSIS — E7800 Pure hypercholesterolemia, unspecified: Secondary | ICD-10-CM

## 2022-11-11 ENCOUNTER — Telehealth: Payer: Self-pay | Admitting: Cardiology

## 2022-11-11 NOTE — Telephone Encounter (Signed)
Called patient and reviewed Dr. Theodosia Blender recommendation that she talk with PCP who prescribed Jardiance about her symptoms and about stopping it. Patient verbalizes understanding and agrees to plan, states her most recent blood sugar was 105 and she is feeling better.

## 2022-11-11 NOTE — Telephone Encounter (Signed)
Spoke with patient and her PCP prescribed Jardiance for her diabetes. She stated since she started taking it Jardiance it was causing abdominal pain, nausea, and last night her blood sugar was 69. She has not taking it today and currently denies any symptoms. I did inform patient tot contact PCP because she is the one who prescribed it.  I did let her know I will make provider aware.

## 2022-11-11 NOTE — Telephone Encounter (Signed)
Pt c/o medication issue:  1. Name of Medication: Jardiance  2. How are you currently taking this medication (dosage and times per day)?   3. Are you having a reaction (difficulty breathing--STAT)? No  4. What is your medication issue? Pt stated that they start this medication a couple days ago because their PCP prescribed it. Pt stated that last night she started sweating, feeling nauseous, and her blood sugar went down to 69. Pt is worried that she should not be taking this medication. Please advise

## 2022-11-24 ENCOUNTER — Telehealth: Payer: Self-pay | Admitting: Cardiology

## 2022-11-24 DIAGNOSIS — I6523 Occlusion and stenosis of bilateral carotid arteries: Secondary | ICD-10-CM

## 2022-11-24 NOTE — Telephone Encounter (Signed)
Patient stated she had Vas US Carotid on 12/29/21. Patient wanted clarification on how often should she have the imagining done. Will forward to MD and nurse for advise.

## 2022-11-24 NOTE — Telephone Encounter (Signed)
Patient called stating she was scheduled for a carotid test, however patient stated she had one done last year and is not do again for another one until next year.  She said she gets every done every 2 years. She doesn't want to come to this test if it's not necessary.  Please advise.

## 2022-11-24 NOTE — Addendum Note (Signed)
Addended by: Luellen Pucker on: 11/24/2022 04:58 PM   Modules accepted: Orders

## 2022-11-24 NOTE — Telephone Encounter (Signed)
Dr. Mayford Knife states carotids do not need to be repeated until 2025. Canceled carotid ordered for this year. Called patient to update. New order placed for 2025.

## 2022-11-24 NOTE — Telephone Encounter (Signed)
Patient with order for 1 year carotid f/u scan (due now) and for 2 year f/u due in April 2025. Message to Dr. Mayford Knife to clarify.

## 2022-11-25 ENCOUNTER — Encounter: Payer: Self-pay | Admitting: Cardiology

## 2022-12-09 ENCOUNTER — Encounter (HOSPITAL_COMMUNITY): Payer: Medicare HMO

## 2023-07-01 ENCOUNTER — Telehealth: Payer: Self-pay | Admitting: Cardiology

## 2023-07-01 NOTE — Telephone Encounter (Signed)
Returned call to dtr.  She reports pt is currently in the hospital at [facility] [place] with afib. Pt is scheduled for an echocardiogram & DCCV today, expected discharge tomorrow. They told dtr that pt needs to follow up with cardiology soon in relation to this hospitalization. Will forward to Dr. Norris Cross team for follow up advisement.

## 2023-07-01 NOTE — Telephone Encounter (Signed)
Pt's daughter states she needs to speak with a nurse. Please advise

## 2023-07-21 ENCOUNTER — Ambulatory Visit (HOSPITAL_COMMUNITY)
Admission: RE | Admit: 2023-07-21 | Discharge: 2023-07-21 | Disposition: A | Discharge status: Home / Self Care | Payer: Medicare HMO | Source: Ambulatory Visit | Attending: Physician Assistant | Admitting: Physician Assistant

## 2023-07-21 VITALS — BP 168/56 | HR 47 | Ht 65.0 in | Wt 154.6 lb

## 2023-07-21 DIAGNOSIS — I10 Essential (primary) hypertension: Secondary | ICD-10-CM | POA: Insufficient documentation

## 2023-07-21 DIAGNOSIS — I480 Paroxysmal atrial fibrillation: Secondary | ICD-10-CM | POA: Diagnosis present

## 2023-07-21 DIAGNOSIS — Z7984 Long term (current) use of oral hypoglycemic drugs: Secondary | ICD-10-CM | POA: Insufficient documentation

## 2023-07-21 DIAGNOSIS — Z5181 Encounter for therapeutic drug level monitoring: Secondary | ICD-10-CM

## 2023-07-21 DIAGNOSIS — D6869 Other thrombophilia: Secondary | ICD-10-CM | POA: Diagnosis not present

## 2023-07-21 DIAGNOSIS — Z79899 Other long term (current) drug therapy: Secondary | ICD-10-CM | POA: Insufficient documentation

## 2023-07-21 DIAGNOSIS — E119 Type 2 diabetes mellitus without complications: Secondary | ICD-10-CM | POA: Diagnosis not present

## 2023-07-21 DIAGNOSIS — E785 Hyperlipidemia, unspecified: Secondary | ICD-10-CM | POA: Diagnosis not present

## 2023-07-21 DIAGNOSIS — I2510 Atherosclerotic heart disease of native coronary artery without angina pectoris: Secondary | ICD-10-CM | POA: Diagnosis not present

## 2023-07-21 DIAGNOSIS — Z7901 Long term (current) use of anticoagulants: Secondary | ICD-10-CM | POA: Insufficient documentation

## 2023-07-21 DIAGNOSIS — Z955 Presence of coronary angioplasty implant and graft: Secondary | ICD-10-CM | POA: Insufficient documentation

## 2023-07-21 DIAGNOSIS — I4719 Other supraventricular tachycardia: Secondary | ICD-10-CM | POA: Diagnosis not present

## 2023-07-21 DIAGNOSIS — I6529 Occlusion and stenosis of unspecified carotid artery: Secondary | ICD-10-CM | POA: Diagnosis not present

## 2023-07-21 NOTE — Progress Notes (Addendum)
Primary Care Physician: Valla Leaver, MD Primary Cardiologist: Dr Mayford Knife Primary Electrophysiologist: none Referring Physician: Dr Clyde Lundborg is a 82 y.o. female with a history of CAD, HTN, SVT, HLD, carotid artery stenosis, DM, atrial fibrillation who presents for follow up in the [facility]. Patient was diagnosed with afib remotely. She had been maintained on metoprolol and diltiazem but had episodes of bradycardia and these were discontinued. She wore a cardiac monitor 09/11/21 which showed avg HR 59 with no significant pauses, no afib. Patient is on Eliquis for a CHADS2VASC score of 6.   Patient reports that she was hospitalized at [facility] for afib with RVR on 07/02/23 and underwent DCCV at that time. Patient and family report her heart rate was up to 190 bpm. She was loaded on amiodarone.   On follow up today, patient reports that there were no specific triggers for her afib that she could identify. She initially felt light headed on 400 mg of amiodarone but this resolved when she decreased her dose to 200 mg daily. Her heart rates have remained in the 40s. No bleeding issues on anticoagulation.   Today, she denies symptoms of palpitations, chest pain, shortness of breath, orthopnea, PND, lower extremity edema, dizziness, presyncope, syncope, snoring, daytime somnolence, bleeding, or neurologic sequela. The patient is tolerating medications without difficulties and is otherwise without complaint today.    Atrial Fibrillation Risk Factors:  she does not have symptoms or diagnosis of sleep apnea. she does not have a history of rheumatic fever.   Atrial Fibrillation Management history:  Previous antiarrhythmic drugs: amiodarone  Previous cardioversions: 07/02/23 [facility]) Previous ablations: none Anticoagulation history: Eliquis   Past Medical History:  Diagnosis Date   Cancer of left breast (HCC) 08/2002   CAP (community  acquired pneumonia) 09/18/2015   Carotid arterial disease (HCC)    1-39% bilaterally by dopplers 11/2021   Coronary artery disease    a. 2009: DES x2 to LAD in [facility]   b. 07/2015: NSTEMI s/p DES to LAD.   Diabetes mellitus, type 2 (HCC)    Hyperlipidemia    Hypertension    Mitral regurgitation    Mild to moderate by echo 09/2021   Myocardial infarction [facility]) 08/07/2015   "very light one"   Persistent atrial fibrillation (HCC)    a. newly diagnosed 07/2015 in the setting of NSTEMI. Spont conv to NSR/SB. Started on Eliquis for CHADS2VASC score of 5   SVT (supraventricular tachycardia)    Current Outpatient Medications  Medication Sig Dispense Refill   amiodarone (PACERONE) 200 MG tablet Take 200 mg by mouth daily.     apixaban (ELIQUIS) 5 MG TABS tablet TAKE 1 TABLET BY MOUTH TWICE A DAY 60 tablet 5   atorvastatin (LIPITOR) 80 MG tablet TAKE 1 TABLET BY MOUTH EVERY DAY 90 tablet 3   BLACK ELDERBERRY PO Gummy- Taking 1 gummy once daily     brimonidine (ALPHAGAN) 0.2 % ophthalmic solution Place 1 drop into both eyes 2 (two) times daily.     glipiZIDE (GLUCOTROL XL) 10 MG 24 hr tablet Take 20 mg by mouth every morning.     Lancets (ONETOUCH DELICA PLUS LANCET30G) MISC      Multiple Vitamins-Minerals (ICAPS AREDS 2 PO) Take 1 tablet by mouth every morning.     ONETOUCH VERIO test strip 1 each 2 (two) times daily.     No current facility-administered medications for this encounter.    ROS- All systems  are reviewed and negative except as per the HPI above.  Physical Exam: Vitals:   07/21/23 0955  BP: (!) 168/56  Pulse: (!) 47  Weight: 70.1 kg  Height: 5\' 5"  (1.651 m)     GEN: Well nourished, well developed in no acute distress NECK: No JVD; No carotid bruits CARDIAC: Regular rate and rhythm, no murmurs, rubs, gallops RESPIRATORY:  Clear to auscultation without rales, wheezing or rhonchi  ABDOMEN: Soft, non-tender, non-distended EXTREMITIES:  No edema; No deformity    Wt  Readings from Last 3 Encounters:  07/21/23 70.1 kg  08/26/22 71 kg  10/07/21 71 kg    EKG today demonstrates  SB Vent. rate 47 BPM PR interval 148 ms QRS duration 82 ms QT/QTcB 482/426 ms   Echo 10/08/22 demonstrated   1. Left ventricular ejection fraction, by estimation, is 55 to 60%. Left  ventricular ejection fraction by 3D volume is 54 %. The left ventricle has  normal function. The left ventricle has no regional wall motion  abnormalities. Left ventricular diastolic parameters are indeterminate.   2. Right ventricular systolic function is normal. The right ventricular  size is normal. There is normal pulmonary artery systolic pressure. The  estimated right ventricular systolic pressure is 21.5 mmHg.   3. The mitral valve is degenerative. Mild mitral valve regurgitation. No  evidence of mitral stenosis. Moderate to severe mitral annular  calcification.   4. The aortic valve is tricuspid. Aortic valve regurgitation is trivial.  No aortic stenosis is present.   5. The inferior vena cava is normal in size with greater than 50%  respiratory variability, suggesting right atrial pressure of 3 mmHg.   Epic records are reviewed at length today  CHA2DS2-VASc Score = 6  The patient's score is based upon: CHF History: 0 HTN History: 1 Diabetes History: 1 Stroke History: 0 Vascular Disease History: 1 Age Score: 2 Gender Score: 1         ASSESSMENT AND PLAN: Paroxysmal Atrial Fibrillation/atrial tachycardia The patient's CHA2DS2-VASc score is 6, indicating a 9.7% annual risk of stroke.   S/p DCCV on 07/02/23, loaded on amiodarone Patient appears to be maintaining SR. Even though her episodes are rare, would recommend rhythm control given the very rapid nature of her afib. Not on scheduled AV nodal agents given symptomatic bradycardia Will decrease amiodarone to 100 mg daily Will check cmet/TSH at follow up visit.  Continue diltiazem 30 mg PRN q 4 hours for heart  racing. Continue Eliquis 5 mg BID  Secondary Hypercoagulable State (ICD10:  D68.69) The patient is at significant risk for stroke/thromboembolism based upon her CHA2DS2-VASc Score of 6.  Continue Apixaban (Eliquis).   HTN Elevated today, has been better controlled at previous visits. No changes today. If still elevated at visit on 12/30 may need to initiate medication.  CAD S/p DES x 3  No anginal symptoms   Follow up with Joni Reining and Dr Mayford Knife as scheduled. AF clinic in 6 months.    Jorja Loa PA-C Afib Clinic [facility] [address] [place], [place] [address] [phone] 07/21/2023 10:09 AM

## 2023-07-21 NOTE — Patient Instructions (Addendum)
Decrease amiodarone to 100mg  once a day (1/2 of the 200mg  tablet)

## 2023-08-06 NOTE — Progress Notes (Unsigned)
  Cardiology Office Note:  .   Date:  08/09/2023  ID:  Brandi Frey, DOB Oct 25, 1940, MRN [id number] PCP: Valla Leaver, MD  [facility] Providers Cardiologist: Armanda Magic, MD {   History of Present Illness: .   Brandi Frey is a 82 y.o. female with a history of CAD, HTN, SVT, HLD, carotid artery stenosis, DM, atrial fibrillation. S/P DCCV, 07/02/2023. Per Afib clinic, she had been maintained on metoprolol and diltiazem but had episodes of bradycardia and these were discontinued. She has maintained NSR on amiodarone 100 mg daily and remains on Eliquis.  Since hospitalization she has been doing well.  She denies any recurrence of rapid heart rhythm, she denies any symptoms related to bradycardia such as chest pain, syncope, presyncope, or fatigue.  ROS: As above otherwise negative  Studies Reviewed: Marland Kitchen   EKG Interpretation Date/Time:  Monday August 09 2023 09:17:11 EST Ventricular Rate:  46 PR Interval:  150 QRS Duration:  88 QT Interval:  496 QTC Calculation: 434 R Axis:   -20  Text Interpretation: Sinus bradycardia When compared with ECG of 21-Jul-2023 10:04, No significant change was found Confirmed by Joni Reining [phone]) on 08/09/2023 9:39:45 AM    Physical Exam:   VS:  BP (!) 180/78 (BP Location: Right Arm, Patient Position: Sitting, Cuff Size: Normal)   Pulse (!) 46   Ht 5\' 5"  (1.651 m)   Wt 152 lb 9.6 oz (69.2 kg)   SpO2 98%   BMI 25.39 kg/m    Wt Readings from Last 3 Encounters:  08/09/23 152 lb 9.6 oz (69.2 kg)  07/21/23 154 lb 9.6 oz (70.1 kg)  08/26/22 156 lb 9.6 oz (71 kg)    GEN: Well nourished, well developed in no acute distress NECK: No JVD; No carotid bruits CARDIAC: RRR, bradycardic, soft systolic murmur,  rubs, gallops RESPIRATORY:  Clear to auscultation without rales, wheezing or rhonchi  ABDOMEN: Soft, non-tender, non-distended EXTREMITIES:  No edema; No deformity   ASSESSMENT AND PLAN: .    A-fib with RVR: Status post  DCCV on 07/02/2023 in ED.  She is now being followed by atrial fibrillation clinic.  The patient remains on Eliquis and is on low dose amiodarone 100 mg daily.  EKG reveals sinus bradycardia today.  She is completely asymptomatic.  She will follow-up with Dr. Mayford Knife on September 07, 2023 on previously scheduled appointment.  She will need to have follow-up TSH, LFTs, and chest x-ray annually.  2.  Hypertension: Blood pressure is elevated in the office today.  I did recheck it and it remains elevated.  She states that at home it runs 136/70.  She will need to monitor this and let us know if her pressure increases.  At this time I will not make any changes   3.  Hypercholesterolemia: She will need to continue on atorvastatin 80 mg daily.  Most recent lab on 08/28/2022 total cholesterol 132, HDL 58, LDL 61.  Follow-up lipid studies can be ordered on follow-up with Dr. Mayford Knife if unless they are completed by primary care.         Signed, Bettey Mare. Liborio Nixon, ANP, AACC

## 2023-08-09 ENCOUNTER — Ambulatory Visit: Payer: Medicare HMO | Attending: Adult Health | Admitting: Adult Health

## 2023-08-09 ENCOUNTER — Encounter: Payer: Self-pay | Admitting: Adult Health

## 2023-08-09 ENCOUNTER — Encounter: Payer: Self-pay | Admitting: Cardiology

## 2023-08-09 VITALS — BP 180/78 | HR 46 | Ht 65.0 in | Wt 152.6 lb

## 2023-08-09 DIAGNOSIS — I4819 Other persistent atrial fibrillation: Secondary | ICD-10-CM

## 2023-08-09 DIAGNOSIS — I2510 Atherosclerotic heart disease of native coronary artery without angina pectoris: Secondary | ICD-10-CM

## 2023-08-09 DIAGNOSIS — I471 Supraventricular tachycardia, unspecified: Secondary | ICD-10-CM | POA: Diagnosis not present

## 2023-08-09 DIAGNOSIS — I10 Essential (primary) hypertension: Secondary | ICD-10-CM

## 2023-08-09 NOTE — Patient Instructions (Signed)
Medication Instructions:  NO CHANGES    Lab Work: NONE   Testing/Procedures: NONE   Follow-Up: At [facility], you and your health needs are our priority.  As part of our continuing mission to provide you with exceptional heart care, we have created designated Provider Care Teams.  These Care Teams include your primary Cardiologist (physician) and Advanced Practice Providers (APPs -  Physician Assistants and Nurse Practitioners) who all work together to provide you with the care you need, when you need it.       Your next appointment:   KEEP SCHEDULED APPOINTMENT WITH DR. Mayford Knife    Provider:   Armanda Magic, MD

## 2023-08-16 ENCOUNTER — Telehealth: Payer: Self-pay

## 2023-08-16 DIAGNOSIS — I471 Supraventricular tachycardia, unspecified: Secondary | ICD-10-CM

## 2023-08-16 DIAGNOSIS — I480 Paroxysmal atrial fibrillation: Secondary | ICD-10-CM

## 2023-08-16 DIAGNOSIS — E7800 Pure hypercholesterolemia, unspecified: Secondary | ICD-10-CM

## 2023-08-16 DIAGNOSIS — I4819 Other persistent atrial fibrillation: Secondary | ICD-10-CM

## 2023-08-16 DIAGNOSIS — Z79899 Other long term (current) drug therapy: Secondary | ICD-10-CM

## 2023-08-16 NOTE — Telephone Encounter (Signed)
 Per Dr. Mayford Knife, CMET, FLP, and CBC ordered. Patient advised via MyChart.

## 2023-08-26 LAB — COMPREHENSIVE METABOLIC PANEL
ALT: 18 [IU]/L (ref 0–32)
AST: 17 [IU]/L (ref 0–40)
Albumin: 4.3 g/dL (ref 3.8–4.8)
Alkaline Phosphatase: 96 [IU]/L (ref 44–121)
BUN/Creatinine Ratio: 19 (ref 12–28)
BUN: 17 mg/dL (ref 8–27)
Bilirubin Total: 0.7 mg/dL (ref 0.0–1.2)
CO2: 25 mmol/L (ref 20–29)
Calcium: 9.4 mg/dL (ref 8.7–10.3)
Chloride: 103 mmol/L (ref 96–106)
Creatinine, Ser: 0.9 mg/dL (ref 0.57–1.00)
Globulin, Total: 3.1 g/dL (ref 1.5–4.5)
Glucose: 172 mg/dL — ABNORMAL HIGH (ref 70–99)
Potassium: 4.9 mmol/L (ref 3.5–5.2)
Sodium: 141 mmol/L (ref 134–144)
Total Protein: 7.4 g/dL (ref 6.0–8.5)
eGFR: 65 mL/min/{1.73_m2} (ref >59)

## 2023-08-26 LAB — CBC WITH DIFFERENTIAL/PLATELET
Basophils Absolute: 0.1 10*3/uL (ref 0.0–0.2)
Basos: 1 %
EOS (ABSOLUTE): 0.2 10*3/uL (ref 0.0–0.4)
Eos: 2 %
Hematocrit: 41.2 % (ref 34.0–46.6)
Hemoglobin: 13.1 g/dL (ref 11.1–15.9)
Immature Grans (Abs): 0 10*3/uL (ref 0.0–0.1)
Immature Granulocytes: 0 %
Lymphocytes Absolute: 1.2 10*3/uL (ref 0.7–3.1)
Lymphs: 14 %
MCH: 27.9 pg (ref 26.6–33.0)
MCHC: 31.8 g/dL (ref 31.5–35.7)
MCV: 88 fL (ref 79–97)
Monocytes Absolute: 0.7 10*3/uL (ref 0.1–0.9)
Monocytes: 9 %
Neutrophils Absolute: 6.1 10*3/uL (ref 1.4–7.0)
Neutrophils: 74 %
Platelets: 224 10*3/uL (ref 150–450)
RBC: 4.7 x10E6/uL (ref 3.77–5.28)
RDW: 13.4 % (ref 11.7–15.4)
WBC: 8.3 10*3/uL (ref 3.4–10.8)

## 2023-08-26 LAB — LIPID PANEL
Chol/HDL Ratio: 2.3 {ratio} (ref 0.0–4.4)
Cholesterol, Total: 159 mg/dL (ref 100–199)
HDL: 69 mg/dL (ref >39)
LDL Chol Calc (NIH): 75 mg/dL (ref 0–99)
Triglycerides: 82 mg/dL (ref 0–149)
VLDL Cholesterol Cal: 15 mg/dL (ref 5–40)

## 2023-09-02 ENCOUNTER — Telehealth: Payer: Self-pay

## 2023-09-02 DIAGNOSIS — Z79899 Other long term (current) drug therapy: Secondary | ICD-10-CM

## 2023-09-02 DIAGNOSIS — E7800 Pure hypercholesterolemia, unspecified: Secondary | ICD-10-CM

## 2023-09-02 MED ORDER — EZETIMIBE 10 MG PO TABS: 10.0000 mg | ORAL_TABLET | Freq: Every day | ORAL | 3 refills | Status: DC

## 2023-09-02 NOTE — Telephone Encounter (Signed)
-----   Message from Armanda Magic sent at 08/26/2023  4:00 PM EST ----- LDL not at goal - add Zetia 10mg  daily and repeat FLP and ALT in 8 weeks

## 2023-09-02 NOTE — Telephone Encounter (Signed)
Call to patient to discuss LDL not at goal - Dr. Mayford Knife recommends that she add Zetia 10mg  daily and repeat FLP and ALT in 8 weeks. Patient verbalizes understanding and agrees to plan. Orders placed, labs released.

## 2023-09-07 ENCOUNTER — Encounter: Payer: Self-pay | Admitting: Cardiology

## 2023-09-07 ENCOUNTER — Ambulatory Visit: Payer: Medicare HMO | Attending: Cardiology | Admitting: Cardiology

## 2023-09-07 VITALS — BP 150/60 | HR 50 | Ht 65.0 in | Wt 152.0 lb

## 2023-09-07 DIAGNOSIS — I480 Paroxysmal atrial fibrillation: Secondary | ICD-10-CM | POA: Diagnosis not present

## 2023-09-07 DIAGNOSIS — I2510 Atherosclerotic heart disease of native coronary artery without angina pectoris: Secondary | ICD-10-CM | POA: Diagnosis not present

## 2023-09-07 DIAGNOSIS — I471 Supraventricular tachycardia, unspecified: Secondary | ICD-10-CM | POA: Diagnosis not present

## 2023-09-07 DIAGNOSIS — Z79899 Other long term (current) drug therapy: Secondary | ICD-10-CM

## 2023-09-07 DIAGNOSIS — I6523 Occlusion and stenosis of bilateral carotid arteries: Secondary | ICD-10-CM

## 2023-09-07 DIAGNOSIS — E7800 Pure hypercholesterolemia, unspecified: Secondary | ICD-10-CM

## 2023-09-07 DIAGNOSIS — I10 Essential (primary) hypertension: Secondary | ICD-10-CM

## 2023-09-07 DIAGNOSIS — I3139 Other pericardial effusion (noninflammatory): Secondary | ICD-10-CM

## 2023-09-07 NOTE — Addendum Note (Signed)
Addended by: Luellen Pucker on: 09/07/2023 08:54 AM   Modules accepted: Orders

## 2023-09-07 NOTE — Patient Instructions (Addendum)
Medication Instructions:  Your physician recommends that you continue on your current medications as directed. Please refer to the Current Medication list given to you today.  *If you need a refill on your cardiac medications before your next appointment, please call your pharmacy*   Lab Work: Please complete a FASTING lipid panel and an ALT at the lab of your choice on or around October 26, 2023. Please take your printed lab order with you.  If you have labs (blood work) drawn today and your tests are completely normal, you will receive your results only by: MyChart Message (if you have MyChart) OR A paper copy in the mail If you have any lab test that is abnormal or we need to change your treatment, we will call you to review the results.   Testing/Procedures: Your physician has requested that you have a carotid duplex in April 2025. This test is an ultrasound of the carotid arteries in your neck. It looks at blood flow through these arteries that supply the brain with blood. Allow one hour for this exam. There are no restrictions or special instructions.    Follow-Up:  Your next appointment:   1 year(s)  Provider:   Armanda Magic, MD     Other Instructions Please track your blood pressure readings at home for one week. Check your blood pressure readings twice a day, once at lunch and once at dinner. Write down the blood pressure, heart rate (if possible), the date and time. You can call our office to report your readings at the end of the week, drop them off at the front desk or submit them over MyChart.

## 2023-09-07 NOTE — Progress Notes (Signed)
Date:  09/07/2023   ID:  Brandi Frey, DOB 08-Dec-1940, MRN [id number]   PCP:  Valla Leaver, MD  Cardiologist:  Armanda Magic, MD  Electrophysiologist:  None   Chief Complaint:  Followup CAD, HLD, HTN  History of Present Illness:    Brandi Frey is a 83 y.o. 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 S/P DCCV on 07/02/23 for recurrent afib.   She is here today for followup and is doing well.  She denies any chest pain or pressure, SOB, DOE, PND, orthopnea, LE edema, dizziness, palpitations or syncope. She is compliant with her meds and is tolerating meds with no SE.    Prior CV studies:   The following studies were reviewed today:  None  Past Medical History:  Diagnosis Date   Cancer of left breast (HCC) 08/2002   CAP (community acquired pneumonia) 09/18/2015   Carotid arterial disease (HCC)    1-39% bilaterally by dopplers 11/2021   Coronary artery disease    a. 2009: DES x2 to LAD in [facility]   b. 07/2015: NSTEMI s/p DES to LAD.   Diabetes mellitus, type 2 (HCC)    Hyperlipidemia    Hypertension    Mitral regurgitation    Mild to moderate by echo 09/2021   Myocardial infarction [facility]) 08/07/2015   "very light one"   Persistent atrial fibrillation (HCC)    a. newly diagnosed 07/2015 in the setting of NSTEMI. Spont conv to NSR/SB. Started on Eliquis for CHADS2VASC score of 5.  s/p DCCV 06/2023   SVT (supraventricular tachycardia) (HCC)    Past Surgical History:  Procedure Laterality Date   ABDOMINAL HYSTERECTOMY  1998   APPENDECTOMY  ~ 1960   BREAST BIOPSY Left 08/2002   "needle biopsy"   BREAST LUMPECTOMY Left 08/2002   CARDIAC CATHETERIZATION N/A 08/07/2015   Procedure: Left Heart Cath and Coronary Angiography;  Surgeon: Kathleene Hazel, MD;  Location: [facility] INVASIVE CV LAB;  Service: Cardiovascular;  Laterality: N/A;   CARDIAC  CATHETERIZATION N/A 08/07/2015   Procedure: Coronary Stent Intervention;  Surgeon: Kathleene Hazel, MD;  Location: [facility] INVASIVE CV LAB;  Service: Cardiovascular;  Laterality: N/A;  mid LAD   CORONARY ANGIOPLASTY WITH STENT PLACEMENT  2009   LAD x 2 with DES   DILATION AND CURETTAGE OF UTERUS  1980s X 1   "lots of bleeding & passing clots"   TUBAL LIGATION  1970     Current Meds  Medication Sig   amiodarone (PACERONE) 200 MG tablet Take 100 mg by mouth daily.   apixaban (ELIQUIS) 5 MG TABS tablet TAKE 1 TABLET BY MOUTH TWICE A DAY   atorvastatin (LIPITOR) 80 MG tablet TAKE 1 TABLET BY MOUTH EVERY DAY   BLACK ELDERBERRY PO Gummy- Taking 1 gummy once daily   brimonidine (ALPHAGAN) 0.2 % ophthalmic solution Place 1 drop into both eyes 2 (two) times daily.   ezetimibe (ZETIA) 10 MG tablet Take 1 tablet (10 mg total) by mouth daily.   glipiZIDE (GLUCOTROL XL) 10 MG 24 hr tablet Take 20 mg by mouth every morning.   Lancets (ONETOUCH DELICA PLUS LANCET30G) MISC    Multiple Vitamins-Minerals (ICAPS AREDS 2 PO) Take 1 tablet by mouth every morning.   ONETOUCH VERIO test strip 1 each 2 (two) times daily.     Allergies:   Latex, Tape, and Metformin  Social History   Tobacco Use   Smoking status: Never   Smokeless tobacco: Never  Vaping Use   Vaping status: Never Used  Substance Use Topics   Alcohol use: Yes    Comment: 09/18/2015 "might have a glass of wine a few times/year"   Drug use: No     Family Hx: The patient's family history includes CVA in her father; Diabetes in her brother; Heart attack in her brother; Heart disease in her brother; Hypertension in her sister; Ovarian cancer in her mother; Stroke in her father.  ROS:   Please see the history of present illness.     All other systems reviewed and are negative.   Labs/Other Tests and Data Reviewed:    Recent Labs: 08/25/2023: ALT 18; BUN 17; Creatinine, Ser 0.90; Hemoglobin 13.1; Platelets 224; Potassium 4.9; Sodium  141   Recent Lipid Panel Lab Results  Component Value Date/Time   CHOL 159 08/25/2023 08:45 AM   TRIG 82 08/25/2023 08:45 AM   HDL 69 08/25/2023 08:45 AM   CHOLHDL 2.3 08/25/2023 08:45 AM   CHOLHDL 2.7 09/20/2015 07:20 AM   LDLCALC 75 08/25/2023 08:45 AM    Wt Readings from Last 3 Encounters:  09/07/23 152 lb (68.9 kg)  08/09/23 152 lb 9.6 oz (69.2 kg)  07/21/23 154 lb 9.6 oz (70.1 kg)     Objective:    Vital Signs:  BP (!) 150/60 (BP Location: Right Arm, Cuff Size: Normal)   Pulse (!) 50   Ht 5\' 5"  (1.651 m)   Wt 152 lb (68.9 kg)   SpO2 96%   BMI 25.29 kg/m    GEN: Well nourished, well developed in no acute distress HEENT: Normal NECK: No JVD; No carotid bruits LYMPHATICS: No lymphadenopathy CARDIAC:RRR, no murmurs, rubs, gallops RESPIRATORY:  Clear to auscultation without rales, wheezing or rhonchi  ABDOMEN: Soft, non-tender, non-distended MUSCULOSKELETAL:  No edema; No deformity  SKIN: Warm and dry NEUROLOGIC:  Alert and oriented x 3 PSYCHIATRIC:  Normal affect  ASSESSMENT & PLAN:    1.  ASCAD -s/p DESx2 to LAD in 2009 and DES to LAD in 07/2015. -She has not had any anginal symptoms since I saw her last -Continue prescription drug management with atorvastatin 80 mg daily, Zetia 10 mg daily with as needed refills  -no ASA due to DOAC  2.  Paroxysmal atrial fibrillation -She remains in normal sinus rhythm and denies any palpitations -Denies any major bleeding issues on DOAC -Continue prescription drug management with amiodarone 100 mg daily and apixaban 5 mg twice daily with as needed refills -She is not on AV nodal blocking agents due to bradycardia -I have personally reviewed and interpreted outside labs performed by patient's PCP which showed SCr 0.9, Hbg 13.2 and K+ 4.9 on 08/2023  3.  HTN -BP is adequate controlled on exam today -She has not required any antihypertensive therapy recently  4.  SVT -Denies any palpitations since I saw her last -Her  Cardizem and beta-blocker were stopped due to bradycardia  5.  H/O pericardial effusion -remote hx of pleuropericarditis in 2016 -no effusion on echo 2018  6.  HLD -LDL goal < 70 -I have personally reviewed and interpreted outside labs performed by patient's PCP which showed LDL 75 and HDL 69>>started on Zetia but only has been on it 1 week -Continue prescription drug management with atorvastatin 80 mg daily and Zetia 10 mg daily with as needed refills -she has repeat lipids in march 24  7.  Bilateral carotid artery stenosis -Dopplers 11/2021 showed 1-39% bilateral stenosis -Repeat carotid Dopplers April 2025 -Continue statin therapy  -No aspirin due to DOAC  Medication Adjustments/Labs and Tests Ordered: Current medicines are reviewed at length with the patient today.  Concerns regarding medicines are outlined above.  Tests Ordered: No orders of the defined types were placed in this encounter.  Medication Changes: No orders of the defined types were placed in this encounter.   Disposition:  Follow up in 1 year(s)  Signed, Armanda Magic, MD  09/07/2023 8:43 AM    [facility]

## 2023-09-16 ENCOUNTER — Encounter: Payer: Self-pay | Admitting: Cardiology

## 2023-09-27 ENCOUNTER — Telehealth: Payer: Self-pay

## 2023-09-27 DIAGNOSIS — I10 Essential (primary) hypertension: Secondary | ICD-10-CM

## 2023-09-27 DIAGNOSIS — Z79899 Other long term (current) drug therapy: Secondary | ICD-10-CM

## 2023-09-27 MED ORDER — LOSARTAN POTASSIUM 25 MG PO TABS: 12.5000 mg | ORAL_TABLET | Freq: Every day | ORAL | 3 refills | Status: DC

## 2023-09-27 NOTE — Telephone Encounter (Signed)
Returned patient's call regarding starting losartan. Losartan 12.5 mg daily sent to pharmacy of choice. Patient agrees to do BMET in one week and to turn in daily BP log at that time per patient message encounter 09/16/23.

## 2023-09-27 NOTE — Telephone Encounter (Signed)
Patient following up. She would like to know if she will need to start on Losartan. If so she would [facility]   *STAT* If patient is at the pharmacy, call can be transferred to refill team.   1. Which medications need to be refilled? (please list name of each medication and dose if known) Losartan  2. Which pharmacy/location (including street and city if local pharmacy) is medication to be sent to? [id number] [place], [place] - [address] [facility].  3. Do they need a 30 day or 90 day supply?

## 2023-10-08 ENCOUNTER — Other Ambulatory Visit: Payer: Self-pay | Admitting: *Deleted

## 2023-10-08 DIAGNOSIS — I10 Essential (primary) hypertension: Secondary | ICD-10-CM

## 2023-10-08 DIAGNOSIS — Z79899 Other long term (current) drug therapy: Secondary | ICD-10-CM

## 2023-10-08 LAB — LIPID PANEL
Chol/HDL Ratio: 2.3 {ratio} (ref 0.0–4.4)
Cholesterol, Total: 127 mg/dL (ref 100–199)
HDL: 56 mg/dL (ref >39)
LDL Chol Calc (NIH): 55 mg/dL (ref 0–99)
Triglycerides: 80 mg/dL (ref 0–149)
VLDL Cholesterol Cal: 16 mg/dL (ref 5–40)

## 2023-10-08 LAB — ALT: ALT: 26 [IU]/L (ref 0–32)

## 2023-10-12 LAB — BASIC METABOLIC PANEL
BUN/Creatinine Ratio: 15 (ref 12–28)
BUN: 12 mg/dL (ref 8–27)
CO2: 22 mmol/L (ref 20–29)
Calcium: 8.9 mg/dL (ref 8.7–10.3)
Chloride: 104 mmol/L (ref 96–106)
Creatinine, Ser: 0.81 mg/dL (ref 0.57–1.00)
Glucose: 121 mg/dL — ABNORMAL HIGH (ref 70–99)
Potassium: 5.2 mmol/L (ref 3.5–5.2)
Sodium: 140 mmol/L (ref 134–144)
eGFR: 73 mL/min/{1.73_m2} (ref >59)

## 2023-10-15 ENCOUNTER — Other Ambulatory Visit: Payer: Self-pay | Admitting: Cardiology

## 2023-10-15 DIAGNOSIS — I2510 Atherosclerotic heart disease of native coronary artery without angina pectoris: Secondary | ICD-10-CM

## 2023-10-15 DIAGNOSIS — E7800 Pure hypercholesterolemia, unspecified: Secondary | ICD-10-CM

## 2023-10-25 ENCOUNTER — Encounter: Payer: Self-pay | Admitting: Cardiology

## 2023-11-29 ENCOUNTER — Ambulatory Visit (HOSPITAL_COMMUNITY)
Admission: RE | Admit: 2023-11-29 | Discharge: 2023-11-29 | Disposition: A | Discharge status: Home / Self Care | Payer: Medicare HMO | Source: Ambulatory Visit | Attending: Cardiology | Admitting: Cardiology

## 2023-11-29 DIAGNOSIS — I6523 Occlusion and stenosis of bilateral carotid arteries: Secondary | ICD-10-CM | POA: Insufficient documentation

## 2023-12-01 ENCOUNTER — Encounter (HOSPITAL_BASED_OUTPATIENT_CLINIC_OR_DEPARTMENT_OTHER): Payer: Self-pay | Admitting: Cardiology

## 2023-12-01 ENCOUNTER — Encounter: Payer: Self-pay | Admitting: Cardiology

## 2023-12-02 ENCOUNTER — Telehealth: Payer: Self-pay | Admitting: Cardiology

## 2023-12-02 DIAGNOSIS — I6523 Occlusion and stenosis of bilateral carotid arteries: Secondary | ICD-10-CM

## 2023-12-02 NOTE — Telephone Encounter (Signed)
 Spoke with patient and discussed carotid ultrasound results.  Per Dr. Micael Adas: 1-39% right ICA stenosis and 40-59% left ICA stenosis.  Also has an enlarged lymph node in the right neck that needs followup>> please get patient in with her PCP for followup of this and forward this result to them   Repeat dopplers in 1 year   Copy of results faxed to PCP Dr. Boris Byars. Patient verbalized understanding of the above and expressed appreciation for follow-up.  Carotid ultrasound order placed.

## 2023-12-02 NOTE — Telephone Encounter (Signed)
Pt returning call in regards to results. Please advise 

## 2023-12-27 ENCOUNTER — Telehealth: Payer: Self-pay | Admitting: Internal Medicine

## 2023-12-27 DIAGNOSIS — I4819 Other persistent atrial fibrillation: Secondary | ICD-10-CM

## 2023-12-27 NOTE — Addendum Note (Signed)
 Addended by: Cherylyn Cos on: 12/27/2023 03:14 PM   Modules accepted: Orders

## 2023-12-27 NOTE — Telephone Encounter (Signed)
 Call to Dr. Boris Byars, spoke to [facility]. Advised that Dr. Micael Adas recommends patient start on TSH replacement therapy. We will also refer to EP. Call to patient and advised that patient start on thyroid hormone replacement therapy. Provided extensive education on how thyroid disease can contribute to afib and other cardiac arrhythmias. Patient verbalizes understanding and agrees to take thyroid replacement hormone prescribed by Dr. Boris Byars. Advised that thyroid replacement medication needs to be taken ideally an hour before any other meds or food on an empty stomach.

## 2023-12-27 NOTE — Telephone Encounter (Signed)
 DOD Call  Key event: PDP Dr. Boris Byars called to les us  know TSH is very high (~ 25)  She notes that patient has no symptoms and therefore there patient does not want any medication changes.  Patient is on amiodarone. Dr. [facility] asked to make our team aware.  Patient will talk with her daughter about potentially starting medications.  Will forward to [facility] and Dr. Micael Adas.  Gloriann Larger, MD FASE [facility] Cardiologist [facility]  [address] [place], [place] [address] [phone]  1:32 PM

## 2024-01-03 NOTE — Progress Notes (Signed)
 Electrophysiology Office Note:   Date:  01/05/2024  ID:  Brandi Frey, DOB 1941/03/10, MRN [id number]  Primary Cardiologist: Gaylyn Keas, MD Electrophysiologist: Ardeen Kohler, MD      History of Present Illness:   Brandi Frey is a 83 y.o. 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   Discussed the use of AI scribe software for clinical note transcription with the patient, who gave verbal consent to proceed.  History of Present Illness Brandi Frey is an 83 year old female who presents for evaluation of her current medication regimen. She is accompanied by her daughter. She was referred by her previous healthcare provider for evaluation of her atrial fibrillation management.  She has a history of atrial fibrillation, since at least 2017. In November 2024, she was started on amiodarone after experiencing an episode, requiring hospitalization in [facility]. This episode was characterized by a rapid heart rate, described as 'going real fast,' with symptoms of pounding, racing, and skipping heartbeats. Since initiating amiodarone, she has not experienced any further sustained episodes but she has subsequently developed thyroid dysfunction and now takes levothyroxine.  She is concerned about the long-term effects of amiodarone on her thyroid, liver, and lungs. Prior to amiodarone, she was on diltiazem  and metoprolol , which she tolerated well without side effects, but these medications were discontinued due to her low resting heart rate, which is in the fifties when not in atrial fibrillation. In March 2025, she was started on losartan  for blood pressure management, but she finds it difficult to split the tablets as prescribed. She is also on Eliquis  as a blood thinner.    Review of systems complete and  found to be negative unless listed in HPI.   EP Information / Studies Reviewed:    EKG is ordered today. Personal review as below.  EKG Interpretation Date/Time:  Tuesday Jan 04 2024 13:25:42 EDT Ventricular Rate:  56 PR Interval:  142 QRS Duration:  84 QT Interval:  474 QTC Calculation: 457 R Axis:   -13  Text Interpretation: Sinus bradycardia When compared with ECG of 09-Aug-2023 09:17, No significant change was found Confirmed by Ardeen Kohler [id number] on 01/04/2024 1:44:35 PM   EKG 09/20/15:    Echo 10/08/22:   1. Left ventricular ejection fraction, by estimation, is 55 to 60%. Left  ventricular ejection fraction by 3D volume is 54 %. The left ventricle has  normal function. The left ventricle has no regional wall motion  abnormalities. Left ventricular diastolic   parameters are indeterminate.   2. Right ventricular systolic function is normal. The right ventricular  size is normal. There is normal pulmonary artery systolic pressure. The  estimated right ventricular systolic pressure is 21.5 mmHg.   3. The mitral valve is degenerative. Mild mitral valve regurgitation. No  evidence of mitral stenosis. Moderate to severe mitral annular  calcification.   4. The aortic valve is tricuspid. Aortic valve regurgitation is trivial.  No aortic stenosis is present.   5. The inferior vena cava is normal in size with greater than 50%  respiratory variability, suggesting right atrial pressure of 3 mmHg.   Risk Assessment/Calculations:    CHA2DS2-VASc Score = 6   This indicates a 9.7% annual risk of stroke. The patient's score is based upon: CHF History: 0 HTN History: 1  Diabetes History: 1 Stroke History: 0 Vascular Disease History: 1 Age Score: 2 Gender Score: 1          Physical Exam:   VS:  BP (!) 148/66   Pulse (!) 56   Ht 5\' 5"  (1.651 m)   Wt 150 lb (68 kg)   SpO2 97%   BMI 24.96 kg/m    Wt Readings from Last 3 Encounters:  01/04/24 150 lb (68 kg)  09/07/23  152 lb (68.9 kg)  08/09/23 152 lb 9.6 oz (69.2 kg)     GEN: Well nourished, well developed in no acute distress NECK: No JVD CARDIAC: Bradycardic, regular RESPIRATORY:  Clear to auscultation without rales, wheezing or rhonchi  ABDOMEN: Soft, non-distended EXTREMITIES:  No edema; No deformity   ASSESSMENT AND PLAN:    #. Paroxysmal atrial fibrillation: Symptomatic, requiring hospitalization. Well controlled on amiodarone, but now with thyroid dysfunction.  #. Secondary hypercoagulable state due to atrial fibrillation:  #. High risk medication use: Amiodarone 100mg  daily. TSH 24 on 11/2023. Started levothyroxine.  - Continue Eliquis  5mg  BID.  - Not on rate control, aside from amiodarone, due to low resting heart rate.  - Discussed treatment options today for AF including changing antiarrhythmic drug therapy to something other than amiodarone (options limited d/t CAD and bradycardia so Tikosyn would be best) vs ablation. Discussed risks, recovery and likelihood of success with each treatment strategy. Risk, benefits, and alternatives to EP study and ablation for afib were discussed. These risks include but are not limited to stroke, bleeding, vascular damage, tamponade, perforation, damage to the esophagus, lungs, phrenic nerve and other structures, pulmonary vein stenosis, worsening renal function, coronary vasospasm and death.  Discussed potential need for repeat ablation procedures and antiarrhythmic drugs after an initial ablation. The patient understands these risk and wishes to proceed.  We will therefore proceed with catheter ablation at the next available time.  Carto, ICE, anesthesia are requested for the procedure.  Will also obtain CT PV protocol prior to the procedure to exclude LAA thrombus and further evaluate atrial anatomy. - Continue amiodarone 100mg  once daily as bridge to ablation. Plan to stop on day of procedure.   #. CAD s/p PCI to LAD:  - Continue atorvastatin  80 mg daily and  Zetia  10 mg daily.  - Not on ASA due to DOAC.   #Hypertension -Above goal today.  Recommend checking blood pressures 1-2 times per week at home and recording the values.  Recommend bringing these recordings to the primary care physician.  Follow up with Dr. Daneil Dunker 3 months after ablation.   Signed, Ardeen Kohler, MD

## 2024-01-04 ENCOUNTER — Ambulatory Visit: Attending: Cardiology | Admitting: Cardiology

## 2024-01-04 ENCOUNTER — Other Ambulatory Visit: Payer: Self-pay

## 2024-01-04 ENCOUNTER — Encounter: Payer: Self-pay | Admitting: Cardiology

## 2024-01-04 VITALS — BP 148/66 | HR 56 | Ht 65.0 in | Wt 150.0 lb

## 2024-01-04 DIAGNOSIS — I4819 Other persistent atrial fibrillation: Secondary | ICD-10-CM

## 2024-01-04 DIAGNOSIS — Z5181 Encounter for therapeutic drug level monitoring: Secondary | ICD-10-CM

## 2024-01-04 DIAGNOSIS — I480 Paroxysmal atrial fibrillation: Secondary | ICD-10-CM

## 2024-01-04 DIAGNOSIS — Z79899 Other long term (current) drug therapy: Secondary | ICD-10-CM | POA: Diagnosis not present

## 2024-01-04 DIAGNOSIS — D6869 Other thrombophilia: Secondary | ICD-10-CM

## 2024-01-04 DIAGNOSIS — I10 Essential (primary) hypertension: Secondary | ICD-10-CM

## 2024-01-04 DIAGNOSIS — I2510 Atherosclerotic heart disease of native coronary artery without angina pectoris: Secondary | ICD-10-CM

## 2024-01-04 MED ORDER — APIXABAN 5 MG PO TABS: 5.0000 mg | ORAL_TABLET | Freq: Two times a day (BID) | ORAL | 1 refills | Status: DC

## 2024-01-04 NOTE — Patient Instructions (Addendum)
 Medication Instructions:  Your physician recommends that you continue on your current medications as directed. Please refer to the Current Medication list given to you today.  *If you need a refill on your cardiac medications before your next appointment, please call your pharmacy*  Lab Work: BMET and CBC - you may go to any [facility] location to have these drawn the week of June 9th  Testing/Procedures: Cardiac CT Your physician has requested that you have cardiac CT. Cardiac computed tomography (CT) is a painless test that uses an x-ray machine to take clear, detailed pictures of your heart. For further information please visit [email or web address] Please follow instruction sheet as given. We will call you to schedule your CT scan. It will be done about three weeks prior to your ablation.  Ablation Your physician has recommended that you have an ablation. Catheter ablation is a medical procedure used to treat some cardiac arrhythmias (irregular heartbeats). During catheter ablation, a long, thin, flexible tube is put into a blood vessel in your groin (upper thigh), or neck. This tube is called an ablation catheter. It is then guided to your heart through the blood vessel. Radio frequency waves destroy small areas of heart tissue where abnormal heartbeats may cause an arrhythmia to start.  You are scheduled for Atrial Fibrillation Ablation on Monday, July 7 with Dr. Clinton Danas.Please arrive at the Main Entrance A at [facility]: [address] [place], [place] [address] at 8:00 AM   Follow-Up: At [facility], you and your health needs are our priority.  As part of our continuing mission to provide you with exceptional heart care, we have created designated Provider Care Teams.  These Care Teams include your primary Cardiologist (physician) and Advanced Practice Providers (APPs -  Physician Assistants and Nurse Practitioners) who all work together to provide you with the care you  need, when you need it.   Your next appointment:   We will contact you about your post-procedure follow up appointments.

## 2024-01-04 NOTE — Telephone Encounter (Signed)
 Prescription refill request for Eliquis  received. Indication:afib Last office visit:1/25 Scr:0.81  3/25 Age: 83 Weight:68.9  kg  Prescription refilled

## 2024-01-06 ENCOUNTER — Encounter: Payer: Self-pay | Admitting: *Deleted

## 2024-01-06 NOTE — Telephone Encounter (Signed)
 Call to Dtr to answer any questions, dtr Valinda Gault states all of her questions have been answered by Dr. Orinda Birkenhead staff.

## 2024-01-18 LAB — BASIC METABOLIC PANEL WITH GFR
BUN/Creatinine Ratio: 23 (ref 12–28)
BUN: 19 mg/dL (ref 8–27)
CO2: 20 mmol/L (ref 20–29)
Calcium: 9.2 mg/dL (ref 8.7–10.3)
Chloride: 104 mmol/L (ref 96–106)
Creatinine, Ser: 0.82 mg/dL (ref 0.57–1.00)
Glucose: 172 mg/dL — ABNORMAL HIGH (ref 70–99)
Potassium: 4.8 mmol/L (ref 3.5–5.2)
Sodium: 141 mmol/L (ref 134–144)
eGFR: 72 mL/min/{1.73_m2} (ref >59)

## 2024-01-18 LAB — CBC
Hematocrit: 40 % (ref 34.0–46.6)
Hemoglobin: 12.3 g/dL (ref 11.1–15.9)
MCH: 28.2 pg (ref 26.6–33.0)
MCHC: 30.8 g/dL — ABNORMAL LOW (ref 31.5–35.7)
MCV: 92 fL (ref 79–97)
Platelets: 232 10*3/uL (ref 150–450)
RBC: 4.36 x10E6/uL (ref 3.77–5.28)
RDW: 13.1 % (ref 11.7–15.4)
WBC: 8.8 10*3/uL (ref 3.4–10.8)

## 2024-01-20 ENCOUNTER — Telehealth: Payer: Self-pay

## 2024-01-20 NOTE — Telephone Encounter (Signed)
 Spoke with patient to complete pre-procedure call.     New medical conditions?  NO Recent hospitalizations or surgeries?NO  Started any new medications? NO Patient made aware to contact office to inform of any new medications started. Any changes in activities of daily living?NO   Pre-procedure testing scheduled: CT on Monday, June 16th, (patient is aware) and lab work is complete.   Confirmed patient is taking Eliquis  5mg  twice daily and will continue taking medication before procedure or it may need to be rescheduled.  Confirmed patient is scheduled for Atrial Fibrillation Ablation on Monday, July 7 with Dr. Clinton Danas. Instructed patient to arrive at the Main Entrance A at [facility]: [address] [place], [place] [address] and check in at Admitting at 8 AM.  Advised of plan to go home the same day and will only stay overnight if medically necessary. You MUST have a responsible adult to drive you home and MUST be with you the first 24 hours after you arrive home or your procedure could be cancelled.  Patient verbalized understanding to information provided and is agreeable to proceed with procedure.

## 2024-01-23 ENCOUNTER — Ambulatory Visit: Payer: Self-pay | Admitting: Cardiology

## 2024-01-24 ENCOUNTER — Ambulatory Visit (HOSPITAL_COMMUNITY)
Admission: RE | Admit: 2024-01-24 | Discharge: 2024-01-24 | Disposition: A | Discharge status: Home / Self Care | Source: Ambulatory Visit | Attending: Cardiology | Admitting: Cardiology

## 2024-01-24 ENCOUNTER — Telehealth: Payer: Self-pay

## 2024-01-24 DIAGNOSIS — Q2112 Patent foramen ovale: Secondary | ICD-10-CM | POA: Insufficient documentation

## 2024-01-24 DIAGNOSIS — Z955 Presence of coronary angioplasty implant and graft: Secondary | ICD-10-CM | POA: Diagnosis not present

## 2024-01-24 DIAGNOSIS — I480 Paroxysmal atrial fibrillation: Secondary | ICD-10-CM | POA: Diagnosis present

## 2024-01-24 MED ORDER — IOHEXOL 350 MG/ML SOLN
100.0000 mL | Freq: Once | INTRAVENOUS | Status: AC | PRN
  Administered 2024-01-24: 100 mL via INTRAVENOUS

## 2024-01-24 NOTE — Telephone Encounter (Signed)
 Pre-procedure Call:  Called patient, NA, left message on VM to contact our office.     [place] 02/14/24 @ 10 (arrive at 8am) Labs - 01/17/24 CT - 01/24/24

## 2024-01-26 ENCOUNTER — Ambulatory Visit (HOSPITAL_COMMUNITY): Payer: Medicare HMO | Admitting: Physician Assistant

## 2024-01-27 NOTE — Telephone Encounter (Signed)
 Spoke with patient to complete pre-procedure call.     New medical conditions?  NO Recent hospitalizations or surgeries? NO  Started any new medications? NO Patient made aware to contact office to inform of any new medications started. Any changes in activities of daily living? NO   Pre-procedure testing scheduled: CT was completed on 01/17/24 and lab work was completed on 01/24/24 Confirmed patient is taking Eliquis  5mg  twice daily and will continue taking medication before procedure or it may need to be rescheduled.  Confirmed patient is scheduled for Atrial Fibrillation Ablation  on Monday, July 7 with Dr. Clinton Danas. Instructed patient to arrive at the Main Entrance A at [facility]: [address] [place], [place] [address] and check in at Admitting at 8 AM  Advised of plan to go home the same day and will only stay overnight if medically necessary. You MUST have a responsible adult to drive you home and MUST be with you the first 24 hours after you arrive home or your procedure could be cancelled.  Patient verbalized understanding to information provided and is agreeable to proceed with procedure.

## 2024-02-03 ENCOUNTER — Telehealth (HOSPITAL_COMMUNITY): Payer: Self-pay

## 2024-02-03 NOTE — Telephone Encounter (Signed)
 Spoke with patient to discuss upcoming procedure.   CT: completed.  Labs: completed.   Any recent signs of acute illness or been started on antibiotics? No Any new medications started? No Any medications to hold? No Any missed doses of blood thinner? No Advised patient to continue taking ANTICOAGULANT: Eliquis  (Apixaban ) twice daily without missing any doses.  Medication instructions:  On the morning of your procedure DO NOT take any medication., including Eliquis  or the procedure may be rescheduled. Nothing to eat or drink after midnight prior to your procedure.  Confirmed patient is scheduled for Atrial Fibrillation Ablation on Monday, July 7 with Dr. Sidra Kitty. Instructed patient to arrive at the Main Entrance A at [facility]: [address] [place], [place] [address] and check in at Admitting at 8:00 AM.  Advised of plan to go home the same day and will only stay overnight if medically necessary. You MUST have a responsible adult to drive you home and MUST be with you the first 24 hours after you arrive home or your procedure could be cancelled.  Patient verbalized understanding to all instructions provided and agreed to proceed with procedure.

## 2024-02-09 ENCOUNTER — Encounter: Payer: Self-pay | Admitting: Emergency Medicine

## 2024-02-13 NOTE — Pre-Procedure Instructions (Signed)
 Instructed patient on the following items: Arrival time 0800 Nothing to eat or drink after midnight No meds AM of procedure Responsible person to drive you home and stay with you for 24 hrs  Have you missed any doses of anti-coagulant Eliquis- takes twice a day, hasn't missed any doses in last 4 weeks.  Don't take dose morning of procedure.

## 2024-02-14 ENCOUNTER — Ambulatory Visit (HOSPITAL_COMMUNITY): Admitting: Anesthesiology

## 2024-02-14 ENCOUNTER — Other Ambulatory Visit: Payer: Self-pay

## 2024-02-14 ENCOUNTER — Encounter (HOSPITAL_COMMUNITY): Payer: Self-pay | Admitting: Cardiology

## 2024-02-14 ENCOUNTER — Encounter (HOSPITAL_COMMUNITY)
Admission: RE | Disposition: A | Discharge status: Home / Self Care | Payer: Self-pay | Source: Home / Self Care | Attending: Cardiology

## 2024-02-14 ENCOUNTER — Ambulatory Visit (HOSPITAL_COMMUNITY)
Admission: RE | Admit: 2024-02-14 | Discharge: 2024-02-14 | Disposition: A | Discharge status: Home / Self Care | Attending: Cardiology | Admitting: Cardiology

## 2024-02-14 DIAGNOSIS — I480 Paroxysmal atrial fibrillation: Secondary | ICD-10-CM

## 2024-02-14 DIAGNOSIS — I10 Essential (primary) hypertension: Secondary | ICD-10-CM | POA: Insufficient documentation

## 2024-02-14 DIAGNOSIS — E079 Disorder of thyroid, unspecified: Secondary | ICD-10-CM | POA: Insufficient documentation

## 2024-02-14 DIAGNOSIS — E119 Type 2 diabetes mellitus without complications: Secondary | ICD-10-CM | POA: Diagnosis not present

## 2024-02-14 DIAGNOSIS — I2510 Atherosclerotic heart disease of native coronary artery without angina pectoris: Secondary | ICD-10-CM

## 2024-02-14 DIAGNOSIS — Z7901 Long term (current) use of anticoagulants: Secondary | ICD-10-CM | POA: Insufficient documentation

## 2024-02-14 DIAGNOSIS — Z79899 Other long term (current) drug therapy: Secondary | ICD-10-CM | POA: Diagnosis not present

## 2024-02-14 DIAGNOSIS — I252 Old myocardial infarction: Secondary | ICD-10-CM | POA: Diagnosis not present

## 2024-02-14 DIAGNOSIS — Z7984 Long term (current) use of oral hypoglycemic drugs: Secondary | ICD-10-CM | POA: Insufficient documentation

## 2024-02-14 DIAGNOSIS — Z955 Presence of coronary angioplasty implant and graft: Secondary | ICD-10-CM | POA: Diagnosis not present

## 2024-02-14 DIAGNOSIS — D6869 Other thrombophilia: Secondary | ICD-10-CM | POA: Insufficient documentation

## 2024-02-14 HISTORY — PX: ATRIAL FIBRILLATION ABLATION: EP1191

## 2024-02-14 LAB — GLUCOSE, CAPILLARY
Glucose-Capillary: 135 mg/dL — ABNORMAL HIGH (ref 70–99)
Glucose-Capillary: 95 mg/dL (ref 70–99)

## 2024-02-14 SURGERY — ATRIAL FIBRILLATION ABLATION
Anesthesia: General

## 2024-02-14 MED ORDER — FENTANYL CITRATE (PF) 100 MCG/2ML IJ SOLN
INTRAMUSCULAR | Status: AC
  Filled 2024-02-14: qty 2

## 2024-02-14 MED ORDER — SUGAMMADEX SODIUM 200 MG/2ML IV SOLN
INTRAVENOUS | Status: DC | PRN
  Administered 2024-02-14 (×2): 200 mg via INTRAVENOUS

## 2024-02-14 MED ORDER — ROCURONIUM BROMIDE 10 MG/ML (PF) SYRINGE
PREFILLED_SYRINGE | INTRAVENOUS | Status: DC | PRN
  Administered 2024-02-14 (×2): 20 mg via INTRAVENOUS
  Administered 2024-02-14: 10 mg via INTRAVENOUS
  Administered 2024-02-14: 60 mg via INTRAVENOUS

## 2024-02-14 MED ORDER — LOSARTAN POTASSIUM 25 MG PO TABS
12.5000 mg | ORAL_TABLET | Freq: Once | ORAL | Status: AC
  Administered 2024-02-14: 12.5 mg via ORAL
  Filled 2024-02-14 (×2): qty 0.5

## 2024-02-14 MED ORDER — HEPARIN (PORCINE) IN NACL 1000-0.9 UT/500ML-% IV SOLN
INTRAVENOUS | Status: DC | PRN
  Administered 2024-02-14 (×3): 500 mL

## 2024-02-14 MED ORDER — PHENYLEPHRINE HCL-NACL 20-0.9 MG/250ML-% IV SOLN
INTRAVENOUS | Status: DC | PRN
  Administered 2024-02-14: 10 ug/min via INTRAVENOUS

## 2024-02-14 MED ORDER — APIXABAN 5 MG PO TABS
5.0000 mg | ORAL_TABLET | Freq: Once | ORAL | Status: AC
  Administered 2024-02-14: 5 mg via ORAL
  Filled 2024-02-14: qty 1

## 2024-02-14 MED ORDER — HEPARIN SODIUM (PORCINE) 1000 UNIT/ML IJ SOLN
INTRAMUSCULAR | Status: DC | PRN
  Administered 2024-02-14: 13000 [IU] via INTRAVENOUS

## 2024-02-14 MED ORDER — SODIUM CHLORIDE 0.9 % IV SOLN: 250.0000 mL | INTRAVENOUS | Status: DC | PRN

## 2024-02-14 MED ORDER — SODIUM CHLORIDE 0.9% FLUSH: 3.0000 mL | INTRAVENOUS | Status: DC | PRN

## 2024-02-14 MED ORDER — EPHEDRINE SULFATE-NACL 50-0.9 MG/10ML-% IV SOSY
PREFILLED_SYRINGE | INTRAVENOUS | Status: DC | PRN
  Administered 2024-02-14 (×3): 5 mg via INTRAVENOUS

## 2024-02-14 MED ORDER — LIDOCAINE 2% (20 MG/ML) 5 ML SYRINGE
INTRAMUSCULAR | Status: DC | PRN
  Administered 2024-02-14: 60 mg via INTRAVENOUS

## 2024-02-14 MED ORDER — FENTANYL CITRATE (PF) 250 MCG/5ML IJ SOLN
INTRAMUSCULAR | Status: DC | PRN
  Administered 2024-02-14 (×2): 50 ug via INTRAVENOUS

## 2024-02-14 MED ORDER — ONDANSETRON HCL 4 MG/2ML IJ SOLN: 4.0000 mg | Freq: Four times a day (QID) | INTRAMUSCULAR | Status: DC | PRN

## 2024-02-14 MED ORDER — PROTAMINE SULFATE 10 MG/ML IV SOLN
INTRAVENOUS | Status: DC | PRN
Start: 2024-02-14 — End: 2024-02-14
  Administered 2024-02-14: 35 mg via INTRAVENOUS

## 2024-02-14 MED ORDER — PROPOFOL 10 MG/ML IV BOLUS
INTRAVENOUS | Status: DC | PRN
  Administered 2024-02-14: 75 ug/kg/min via INTRAVENOUS
  Administered 2024-02-14: 110 mg via INTRAVENOUS

## 2024-02-14 MED ORDER — SODIUM CHLORIDE 0.9 % IV SOLN: INTRAVENOUS | Status: DC

## 2024-02-14 MED ORDER — ACETAMINOPHEN 325 MG PO TABS: 650.0000 mg | ORAL_TABLET | ORAL | Status: DC | PRN

## 2024-02-14 MED ORDER — ONDANSETRON HCL 4 MG/2ML IJ SOLN
INTRAMUSCULAR | Status: DC | PRN
  Administered 2024-02-14: 4 mg via INTRAVENOUS

## 2024-02-14 MED ORDER — ATROPINE SULFATE 1 MG/ML IV SOLN
INTRAVENOUS | Status: DC | PRN
  Administered 2024-02-14: 1 mg via INTRAVENOUS

## 2024-02-14 MED ORDER — SODIUM CHLORIDE 0.9% FLUSH: 3.0000 mL | Freq: Two times a day (BID) | INTRAVENOUS | Status: DC

## 2024-02-14 SURGICAL SUPPLY — 20 items
BAG SNAP BAND KOVER 36X36 (MISCELLANEOUS) IMPLANT
CABLE FARASTAR GEN2 SNGL USE (CABLE) IMPLANT
CATH BI DIR 7FR CS F-J 12 PIN (CATHETERS) IMPLANT
CATH FARAWAVE 2.0 31 (CATHETERS) IMPLANT
CATH GE 8FR SOUNDSTAR (CATHETERS) IMPLANT
CATH OCTARAY 2.0 F 3-3-3-3-3 (CATHETERS) IMPLANT
CLOSURE PERCLOSE PROSTYLE (VASCULAR PRODUCTS) IMPLANT
COVER SWIFTLINK CONNECTOR (BAG) ×1 IMPLANT
DEVICE CLOSURE MYNXGRIP 6/7F (Vascular Products) IMPLANT
DILATOR VESSEL 38 20CM 16FR (INTRODUCER) IMPLANT
GUIDEWIRE INQWIRE 1.5J.035X260 (WIRE) IMPLANT
KIT VERSACROSS CNCT FARADRIVE (KITS) IMPLANT
PACK EP LF (CUSTOM PROCEDURE TRAY) ×1 IMPLANT
PAD DEFIB RADIO PHYSIO CONN (PAD) ×1 IMPLANT
PATCH CARTO3 (PAD) IMPLANT
SHEATH FARADRIVE STEERABLE (SHEATH) IMPLANT
SHEATH PINNACLE 5F 10CM (SHEATH) IMPLANT
SHEATH PINNACLE 8F 10CM (SHEATH) IMPLANT
SHEATH PINNACLE VASC 9FR (SHEATH) IMPLANT
SHEATH PROBE COVER 6X72 (BAG) IMPLANT

## 2024-02-14 NOTE — Anesthesia Procedure Notes (Signed)
 Procedure Name: Intubation Date/Time: 02/14/2024 11:36 AM  Performed by: Mollie Olivia SAUNDERS, CRNAPre-anesthesia Checklist: Patient identified, Emergency Drugs available, Suction available and Patient being monitored Patient Re-evaluated:Patient Re-evaluated prior to induction Oxygen Delivery Method: Circle system utilized Preoxygenation: Pre-oxygenation with 100% oxygen Induction Type: IV induction Ventilation: Mask ventilation without difficulty Laryngoscope Size: 3 and Mac Grade View: Grade II Tube type: Oral Tube size: 7.0 mm Number of attempts: 1 Airway Equipment and Method: Stylet and Oral airway Placement Confirmation: ETT inserted through vocal cords under direct vision, positive ETCO2 and breath sounds checked- equal and bilateral Secured at: 22 cm Tube secured with: Tape Dental Injury: Teeth and Oropharynx as per pre-operative assessment  Comments: A lot of pink mucosa and large tongue

## 2024-02-14 NOTE — Transfer of Care (Signed)
 Immediate Anesthesia Transfer of Care Note  Patient: Brandi Frey  Procedure(s) Performed: ATRIAL FIBRILLATION ABLATION  Patient Location: PACU and Short Stay  Anesthesia Type:General  Level of Consciousness: awake, drowsy, and responds to stimulation  Airway & Oxygen Therapy: Patient Spontanous Breathing and Patient connected to face mask oxygen  Post-op Assessment: Report given to RN and Post -op Vital signs reviewed and stable  Post vital signs: Reviewed and stable  Last Vitals:  Vitals Value Taken Time  BP    Temp 36.9 C 02/14/24 13:43  Pulse 59 02/14/24 13:44  Resp 16 02/14/24 13:44  SpO2 100 % 02/14/24 13:44  Vitals shown include unfiled device data.  Last Pain:  Vitals:   02/14/24 1343  TempSrc:   PainSc: Asleep         Complications: There were no known notable events for this encounter.

## 2024-02-14 NOTE — Progress Notes (Signed)
 Patient walked to the bathroom without difficulties. Bilateral groins level 0, clean, dry, and intact.

## 2024-02-14 NOTE — Discharge Instructions (Signed)

## 2024-02-14 NOTE — Anesthesia Preprocedure Evaluation (Addendum)
 Anesthesia Evaluation  Patient identified by MRN, date of birth, ID band Patient awake    Reviewed: Allergy & Precautions, NPO status , Patient's Chart, lab work & pertinent test results  Airway Mallampati: II  TM Distance: >3 FB Neck ROM: Full    Dental no notable dental hx. (+) Upper Dentures   Pulmonary    Pulmonary exam normal        Cardiovascular hypertension, Pt. on medications + CAD, + Past MI and + Cardiac Stents  + dysrhythmias Atrial Fibrillation  Rhythm:Irregular Rate:Normal     Neuro/Psych negative neurological ROS  negative psych ROS   GI/Hepatic negative GI ROS, Neg liver ROS,,,  Endo/Other  diabetes, Type 2, Oral Hypoglycemic Agents    Renal/GU   negative genitourinary   Musculoskeletal negative musculoskeletal ROS (+)    Abdominal Normal abdominal exam  (+)   Peds  Hematology Lab Results      Component                Value               Date                      WBC                      8.8                 01/17/2024                HGB                      12.3                01/17/2024                HCT                      40.0                01/17/2024                MCV                      92                  01/17/2024                PLT                      232                 01/17/2024              Anesthesia Other Findings   Reproductive/Obstetrics                              Anesthesia Physical Anesthesia Plan  ASA: 3  Anesthesia Plan: General   Post-op Pain Management:    Induction: Intravenous  PONV Risk Score and Plan: 3 and Ondansetron , Dexamethasone and Treatment may vary due to age or medical condition  Airway Management Planned: Mask and Oral ETT  Additional Equipment: None  Intra-op Plan:   Post-operative Plan: Extubation in OR  Informed Consent: I have reviewed the patients History and Physical, chart, labs and discussed the  procedure  including the risks, benefits and alternatives for the proposed anesthesia with the patient or authorized representative who has indicated his/her understanding and acceptance.     Dental advisory given  Plan Discussed with: CRNA  Anesthesia Plan Comments:          Anesthesia Quick Evaluation

## 2024-02-14 NOTE — Anesthesia Postprocedure Evaluation (Signed)
 Anesthesia Post Note  Patient: Brandi Frey  Procedure(s) Performed: ATRIAL FIBRILLATION ABLATION     Patient location during evaluation: PACU Anesthesia Type: General Level of consciousness: awake and alert Pain management: pain level controlled Vital Signs Assessment: post-procedure vital signs reviewed and stable Respiratory status: spontaneous breathing, nonlabored ventilation, respiratory function stable and patient connected to nasal cannula oxygen Cardiovascular status: blood pressure returned to baseline and stable Postop Assessment: no apparent nausea or vomiting Anesthetic complications: no   There were no known notable events for this encounter.  Last Vitals:  Vitals:   02/14/24 1545 02/14/24 1600  BP: (!) 160/60 (!) 161/62  Pulse: 61 (!) 59  Resp: 18 19  Temp:    SpO2: 97% 98%    Last Pain:  Vitals:   02/14/24 1529  TempSrc:   PainSc: 0-No pain                 Cordella P Thorn Demas

## 2024-02-14 NOTE — H&P (Signed)
 Electrophysiology Note:   Date:  02/14/24  ID:  Brandi Frey [place], DOB 1941-03-26, MRN [id number]   Primary Cardiologist: Wilbert Bihari, MD Electrophysiologist: Fonda Kitty, MD       History of Present Illness:   Brandi Frey is a 83 y.o. 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    Discussed the use of AI scribe software for clinical note transcription with the patient, who gave verbal consent to proceed.   History of Present Illness Brandi Frey is an 83 year old female who presents for evaluation of her current medication regimen. She is accompanied by her daughter. She was referred by her previous healthcare provider for evaluation of her atrial fibrillation management.   She has a history of atrial fibrillation, since at least 2017. In November 2024, she was started on amiodarone after experiencing an episode, requiring hospitalization in [facility]. This episode was characterized by a rapid heart rate, described as 'going real fast,' with symptoms of pounding, racing, and skipping heartbeats. Since initiating amiodarone, she has not experienced any further sustained episodes but she has subsequently developed thyroid dysfunction and now takes levothyroxine.  She is concerned about the long-term effects of amiodarone on her thyroid, liver, and lungs. Prior to amiodarone, she was on diltiazem  and metoprolol , which she tolerated well without side effects, but these medications were discontinued due to her low resting heart rate, which is in the fifties when not in atrial fibrillation. In March 2025, she was started on losartan  for blood pressure management, but she finds it difficult to split the tablets as prescribed. She is also on Eliquis  as a blood thinner.    Interval: Patient presents today  for scheduled catheter ablation. Reports feeling relatively well. No new or acute complaints.    Review of systems complete and found to be negative unless listed in HPI.    EP Information / Studies Reviewed:       EKG Interpretation Date/Time:                  Tuesday Jan 04 2024 13:25:42 EDT Ventricular Rate:         56 PR Interval:                 142 QRS Duration:             84 QT Interval:                 474 QTC Calculation:457 R Axis:                         -13   Text Interpretation:Sinus bradycardia When compared with ECG of 09-Aug-2023 09:17, No significant change was found Confirmed by Kitty Fonda [phone]) on 01/04/2024 1:44:35 PM    EKG 09/20/15:     Echo 10/08/22:   1. Left ventricular ejection fraction, by estimation, is 55 to 60%. Left  ventricular ejection fraction by 3D volume is 54 %. The left ventricle has  normal function. The left ventricle has no regional wall motion  abnormalities. Left ventricular diastolic   parameters are indeterminate.   2. Right ventricular systolic function is normal. The right ventricular  size is normal. There is normal pulmonary artery systolic pressure. The  estimated right ventricular  systolic pressure is 21.5 mmHg.   3. The mitral valve is degenerative. Mild mitral valve regurgitation. No  evidence of mitral stenosis. Moderate to severe mitral annular  calcification.   4. The aortic valve is tricuspid. Aortic valve regurgitation is trivial.  No aortic stenosis is present.   5. The inferior vena cava is normal in size with greater than 50%  respiratory variability, suggesting right atrial pressure of 3 mmHg.    Risk Assessment/Calculations:     CHA2DS2-VASc Score = 6   This indicates a 9.7% annual risk of stroke. The patient's score is based upon: CHF History: 0 HTN History: 1 Diabetes History: 1 Stroke History: 0 Vascular Disease History: 1 Age Score: 2 Gender Score: 1             Physical Exam:    Vitals:    02/14/24 0834  BP: (!) 161/53  Pulse: (!) 54  Resp: 18  Temp: 98.1 F (36.7 C)  SpO2: 97%     GEN: Well nourished, well developed in no acute distress NECK: No JVD CARDIAC: Bradycardic, regular RESPIRATORY:  Clear to auscultation without rales, wheezing or rhonchi  ABDOMEN: Soft, non-distended EXTREMITIES:  No edema; No deformity    ASSESSMENT AND PLAN:     #. Paroxysmal atrial fibrillation: Symptomatic, requiring hospitalization. Well controlled on amiodarone, but now with thyroid dysfunction.  #. Secondary hypercoagulable state due to atrial fibrillation:  #. High risk medication use: Amiodarone 100mg  daily. TSH 24 on 11/2023. Started levothyroxine.  - Continue Eliquis  5mg  BID.  - Not on rate control, aside from amiodarone, due to low resting heart rate.  - Discussed treatment options today for AF including changing antiarrhythmic drug therapy to something other than amiodarone (options limited d/t CAD and bradycardia so Tikosyn would be best) vs ablation. Discussed risks, recovery and likelihood of success with each treatment strategy. Risk, benefits, and alternatives to EP study and ablation for afib were discussed. These risks include but are not limited to stroke, bleeding, vascular damage, tamponade, perforation, damage to the esophagus, lungs, phrenic nerve and other structures, pulmonary vein stenosis, worsening renal function, coronary vasospasm and death.  Discussed potential need for repeat ablation procedures and antiarrhythmic drugs after an initial ablation. The patient understands these risk and wishes to proceed today. - Stop amiodarone.     Follow up with Dr. Kennyth 3 months after ablation.    Signed, Fonda Kennyth, MD

## 2024-02-15 ENCOUNTER — Telehealth (HOSPITAL_COMMUNITY): Payer: Self-pay

## 2024-02-15 LAB — POCT ACTIVATED CLOTTING TIME: Activated Clotting Time: 354 s

## 2024-02-15 NOTE — Telephone Encounter (Signed)
 Spoke with patient to complete post procedure follow up call.  Patient reports no complications with groin sites.   Instructions reviewed with patient:  Remove large bandage at puncture site after 24 hours. It is normal to have bruising, tenderness, mild swelling, and a pea or marble sized lump/knot at the groin site which can take up to three months to resolve.  Get help right away if you notice sudden swelling at the puncture site.  Check your puncture site every day for signs of infection: fever, redness, swelling, pus drainage, warmth, foul odor or excessive pain. If this occurs, please call the office at [phone], to speak with the nurse. Get help right away if your puncture site is bleeding and the bleeding does not stop after applying firm pressure to the area.  You may continue to have skipped beats/ atrial fibrillation during the first several months after your procedure.  It is very important not to miss any doses of your blood thinner Eliquis . Patient restarted taking this medication on 02/14/24.   You will follow up with the Afib clinic on 03/13/24 and follow up with the APP on 05/16/24.   Patient verbalized understanding to all instructions provided.

## 2024-02-18 MED FILL — Fentanyl Citrate Preservative Free (PF) Inj 100 MCG/2ML: INTRAMUSCULAR | Qty: 2 | Status: AC

## 2024-03-13 ENCOUNTER — Ambulatory Visit (HOSPITAL_COMMUNITY)
Admission: RE | Admit: 2024-03-13 | Discharge: 2024-03-13 | Disposition: A | Discharge status: Home / Self Care | Source: Ambulatory Visit | Attending: Internal Medicine | Admitting: Internal Medicine

## 2024-03-13 VITALS — BP 170/60 | HR 56 | Ht 65.0 in | Wt 146.6 lb

## 2024-03-13 DIAGNOSIS — I4891 Unspecified atrial fibrillation: Secondary | ICD-10-CM

## 2024-03-13 DIAGNOSIS — D6869 Other thrombophilia: Secondary | ICD-10-CM | POA: Diagnosis not present

## 2024-03-13 DIAGNOSIS — I480 Paroxysmal atrial fibrillation: Secondary | ICD-10-CM

## 2024-03-13 NOTE — Progress Notes (Signed)
 Primary Care Physician: Brandi Pat, MD Primary Cardiologist: Dr Shlomo Primary Electrophysiologist: none Referring Physician: Dr Shlomo Brandi Frey is a 83 y.o. female with a history of CAD, HTN, SVT, HLD, carotid artery stenosis, DM, atrial fibrillation who presents for follow up in the [facility]. Patient was diagnosed with afib remotely. She had been maintained on metoprolol  and diltiazem  but had episodes of bradycardia and these were discontinued. She wore a cardiac monitor 09/11/21 which showed avg HR 59 with no significant pauses, no afib. Patient is on Eliquis  for a CHADS2VASC score of 6.   Patient reports that she was hospitalized at [facility] for afib with RVR on 07/02/23 and underwent DCCV at that time. Patient and family report her heart rate was up to 190 bpm. She was loaded on amiodarone.   On follow up today, patient reports that there were no specific triggers for her afib that she could identify. She initially felt light headed on 400 mg of amiodarone but this resolved when she decreased her dose to 200 mg daily. Her heart rates have remained in the 40s. No bleeding issues on anticoagulation.   On follow up 03/13/24, patient is currently in NSR. S/p Afib ablation on 02/14/24 by Dr. Kennyth. No episodes of Afib since ablation. Amiodarone stopped after ablation. No chest pain or SOB. Leg sites healed without issue. No missed doses of Eliquis  5 mg BID.   Today, she denies symptoms of orthopnea, PND, lower extremity edema, dizziness, presyncope, syncope, snoring, daytime somnolence, bleeding, or neurologic sequela. The patient is tolerating medications without difficulties and is otherwise without complaint today.    Atrial Fibrillation Risk Factors:  she does not have symptoms or diagnosis of sleep apnea. she does not have a history of rheumatic fever.   Atrial Fibrillation Management history:  Previous antiarrhythmic drugs:  amiodarone  Previous cardioversions: 07/02/23 [facility] [place]) Previous ablations: 02/14/24 Anticoagulation history: Eliquis    Past Medical History:  Diagnosis Date   Cancer of left breast (HCC) 08/2002   CAP (community acquired pneumonia) 09/18/2015   Carotid arterial disease (HCC)    1-39% right ICA stenosis and 40-59% left ICA stenosis doppler 11/2023   Coronary artery disease    a. 2009: DES x2 to LAD in [facility]   b. 07/2015: NSTEMI s/p DES to LAD.   Diabetes mellitus, type 2 (HCC)    Hyperlipidemia    Hypertension    Mitral regurgitation    Mild to moderate by echo 09/2021   Myocardial infarction (HCC) 08/07/2015   very light one   Persistent atrial fibrillation (HCC)    a. newly diagnosed 07/2015 in the setting of NSTEMI. Spont conv to NSR/SB. Started on Eliquis  for CHADS2VASC score of 5.  s/p DCCV 06/2023   SVT (supraventricular tachycardia) (HCC)    Current Outpatient Medications  Medication Sig Dispense Refill   apixaban  (ELIQUIS ) 5 MG TABS tablet Take 1 tablet (5 mg total) by mouth 2 (two) times daily. 180 tablet 1   atorvastatin  (LIPITOR) 80 MG tablet TAKE 1 TABLET BY MOUTH EVERY DAY 90 tablet 3   BLACK ELDERBERRY PO Take 1 each by mouth daily. Gummy     brimonidine (ALPHAGAN) 0.2 % ophthalmic solution Place 1 drop into both eyes 2 (two) times daily.     ezetimibe  (ZETIA ) 10 MG tablet Take 10 mg by mouth daily.     glipiZIDE (GLUCOTROL XL) 10 MG 24 hr tablet Take 20 mg by mouth every morning. (Patient  taking differently: Take 20 mg by mouth every morning. Taking 10 mg by mouth twice daily)     Lancets (ONETOUCH DELICA PLUS LANCET30G) MISC      levothyroxine (SYNTHROID) 50 MCG tablet Take 50 mcg by mouth daily before breakfast.     losartan  (COZAAR ) 25 MG tablet Take 12.5 mg by mouth daily.     Multiple Vitamins-Minerals (ICAPS AREDS 2 PO) Take 1 tablet by mouth in the morning and at bedtime.     ONETOUCH VERIO test strip 1 each 2 (two) times daily.     No current  facility-administered medications for this encounter.    ROS- All systems are reviewed and negative except as per the HPI above.  Physical Exam: Vitals:   03/13/24 1034  BP: (!) 170/60  Pulse: (!) 56  Weight: 66.5 kg  Height: 5' 5 (1.651 m)    GEN- The patient is well appearing, alert and oriented x 3 today.   Neck - no JVD or carotid bruit noted Lungs- Clear to ausculation bilaterally, normal work of breathing Heart- Regular rate and rhythm, no murmurs, rubs or gallops, PMI not laterally displaced Extremities- no clubbing, cyanosis, or edema Skin - no rash or ecchymosis noted   Wt Readings from Last 3 Encounters:  03/13/24 66.5 kg  02/14/24 68 kg  01/04/24 68 kg    EKG today demonstrates  Vent. rate 56 BPM PR interval 148 ms QRS duration 82 ms QT/QTcB 460/443 ms P-R-T axes 80 -6 6 Sinus bradycardia with Premature supraventricular complexes Possible Anterior infarct , age undetermined Abnormal ECG When compared with ECG of 14-Feb-2024 14:09, Premature supraventricular complexes are now Present  Echo 10/08/22 demonstrated   1. Left ventricular ejection fraction, by estimation, is 55 to 60%. Left  ventricular ejection fraction by 3D volume is 54 %. The left ventricle has  normal function. The left ventricle has no regional wall motion  abnormalities. Left ventricular diastolic parameters are indeterminate.   2. Right ventricular systolic function is normal. The right ventricular  size is normal. There is normal pulmonary artery systolic pressure. The  estimated right ventricular systolic pressure is 21.5 mmHg.   3. The mitral valve is degenerative. Mild mitral valve regurgitation. No  evidence of mitral stenosis. Moderate to severe mitral annular  calcification.   4. The aortic valve is tricuspid. Aortic valve regurgitation is trivial.  No aortic stenosis is present.   5. The inferior vena cava is normal in size with greater than 50%  respiratory variability,  suggesting right atrial pressure of 3 mmHg.   Epic records are reviewed at length today  CHA2DS2-VASc Score = 6  The patient's score is based upon: CHF History: 0 HTN History: 1 Diabetes History: 1 Stroke History: 0 Vascular Disease History: 1 Age Score: 2 Gender Score: 1       ASSESSMENT AND PLAN: Paroxysmal Atrial Fibrillation/atrial tachycardia The patient's CHA2DS2-VASc score is 6, indicating a 9.7% annual risk of stroke.   S/p DCCV on 07/02/23, loaded on amiodarone. S/p Afib ablation on 02/14/24 by Dr. Kennyth.  She is currently in NSR.  Amiodarone stopped after ablation.  Secondary Hypercoagulable State (ICD10:  D68.69) The patient is at significant risk for stroke/thromboembolism based upon her CHA2DS2-VASc Score of 6.  Continue Apixaban  (Eliquis ).  No missed doses.   HTN Elevated today but patient admits to having a salt-filled breakfast. Her BP at home is 130-140s systolic. Continue to trend.  CAD S/p DES x 3  No chest pain.   Follow  up with EP as scheduled.    Brandi Heinrich, PA-C Afib Clinic [facility] [address] [place], [place] [address] [phone] 03/13/2024 11:07 AM

## 2024-05-15 NOTE — Progress Notes (Unsigned)
 Electrophysiology Office Note:   Date:  05/16/2024  ID:  Brandi Frey, DOB 02-28-1941, MRN [id number]  Primary Cardiologist: Wilbert Bihari, MD Primary Heart Failure: None Electrophysiologist: Fonda Kitty, MD      History of Present Illness:   Brandi Frey is a 83 y.o. female with h/o Af, SVT, HTN, HLD, CAD, carotic artery disease, DM II, left breast CA seen today for routine electrophysiology follow-up s/p Ablation.  Since last being seen in our clinic the patient reports doing well overall. 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 She remains very active. She has not had any recurrent known AF.  She wears an Scientist, physiological.  No missed doses of Eliquis  and no bleeding.  She denies chest pain, palpitations, dyspnea, PND, orthopnea, nausea, vomiting, dizziness, syncope, edema, weight gain, or early satiety.    Review of systems complete and found to be negative unless listed in HPI.   EP Information / Studies Reviewed:    EKG is ordered today. Personal review as below.  EKG Interpretation Date/Time:  Tuesday May 16 2024 10:32:30 EDT Ventricular Rate:  53 PR Interval:  136 QRS Duration:  80 QT Interval:  460 QTC Calculation: 431 R Axis:   -9  Text Interpretation: Sinus bradycardia Confirmed by Brandi Frey ([id number]) on 05/16/2024 10:55:03 AM    Arrhythmia / AAD / Pertinent EP Studies AF > initial dx ~ 07/2015 in setting of NSTEMI DCCV 07/02/23 ([place]) [place] on diltiazem  + metoprolol  > stopped    Amiodarone 06/2023 > 02/2024 post ablation  EPS 02/14/24 > PVI and posterior wall ablation   Risk Assessment/Calculations:    CHA2DS2-VASc Score = 6   This indicates a 9.7% annual risk of stroke. The patient's score is based upon: CHF History: 0 HTN History: 1 Diabetes History: 1 Stroke History: 0 Vascular Disease History: 1 Age Score: 2 Gender Score: 1    HYPERTENSION CONTROL Vitals:   05/16/24 1029 05/16/24 1114  BP: (!)  144/58 (!) 140/60    The patient's blood pressure is elevated above target today.  In order to address the patient's elevated BP: Blood pressure will be monitored at home to determine if medication changes need to be made.           Physical Exam:   VS:  BP (!) 140/60   Pulse (!) 53   Ht 5' 5 (1.651 m)   Wt 144 lb 8 oz (65.5 kg)   SpO2 99%   BMI 24.05 kg/m    Wt Readings from Last 3 Encounters:  05/16/24 144 lb 8 oz (65.5 kg)  03/13/24 146 lb 9.6 oz (66.5 kg)  02/14/24 150 lb (68 kg)     GEN: Well nourished, well developed in no acute distress NECK: No JVD; No carotid bruits CARDIAC: Regular rate and rhythm, no murmurs, rubs, gallops RESPIRATORY:  Clear to auscultation without rales, wheezing or rhonchi  ABDOMEN: Soft, non-tender, non-distended EXTREMITIES:  No edema; No deformity   ASSESSMENT AND PLAN:    Paroxysmal Atrial Fibrillation  Atrial Tachycardia  CHA2DS2-VASc 6, s/p ablation 02/2024  -EKG with NSR   -no recurrent AF symptoms post ablation / off amiodarone   -she is pending f/u with a new PCP to see if she can come off synthroid (started with amiodarone) -monitors with an Apple Watch  -OAC for stroke prophylaxis   Secondary Hypercoagulable State  -continue Eliquis , dose reviewed and appropriate by wt / Cr  Hypertension  -pt  reports BP at home 120's systolic / daughter confirms -slightly elevated in clinic > discussed 140 or less at home, otherwise let clinic know   CAD s/p DES x3  -no anginal symptoms   Follow up with Dr. Kennyth or EP APP in 12 months  Signed, Brandi Barrack, NP-C, AGACNP-BC [facility] - Electrophysiology  05/16/2024, 11:14 AM

## 2024-05-16 ENCOUNTER — Encounter: Payer: Self-pay | Admitting: Pulmonary Disease

## 2024-05-16 ENCOUNTER — Ambulatory Visit: Attending: Pulmonary Disease | Admitting: Pulmonary Disease

## 2024-05-16 VITALS — BP 140/60 | HR 53 | Ht 65.0 in | Wt 144.5 lb

## 2024-05-16 DIAGNOSIS — D6869 Other thrombophilia: Secondary | ICD-10-CM

## 2024-05-16 DIAGNOSIS — I2510 Atherosclerotic heart disease of native coronary artery without angina pectoris: Secondary | ICD-10-CM

## 2024-05-16 DIAGNOSIS — I10 Essential (primary) hypertension: Secondary | ICD-10-CM

## 2024-05-16 DIAGNOSIS — I480 Paroxysmal atrial fibrillation: Secondary | ICD-10-CM

## 2024-05-16 NOTE — Patient Instructions (Signed)
 Medication Instructions:  No medications changes today   *If you need a refill on your cardiac medications before your next appointment, please call your pharmacy*  Lab Work: No lab work today.  Please check in with your PCP regarding your Thyroid testing off amiodarone to determine if you can come off synthroid.    If you have labs (blood work) drawn today and your tests are completely normal, you will receive your results only by: MyChart Message (if you have MyChart) OR A paper copy in the mail If you have any lab test that is abnormal or we need to change your treatment, we will call you to review the results.  Testing/Procedures: No testing/procedures were scheduled today  Follow-Up: At [facility], you and your health needs are our priority.  As part of our continuing mission to provide you with exceptional heart care, our providers are all part of one team.  This team includes your primary Cardiologist (physician) and Advanced Practice Providers or APPs (Physician Assistants and Nurse Practitioners) who all work together to provide you with the care you need, when you need it.  Your next appointment:   1 year(s)  Provider:   You may see Brandi Kitty, MD or one of the following Advanced Practice Providers on your designated Care Team:    Brandi Barrack, NP    We recommend signing up for the patient portal called MyChart.  Sign up information is provided on this After Visit Summary.  MyChart is used to connect with patients for Virtual Visits (Telemedicine).  Patients are able to view lab/test results, encounter notes, upcoming appointments, etc.  Non-urgent messages can be sent to your provider as well.   To learn more about what you can do with MyChart, go to [email or web address].

## 2024-06-27 ENCOUNTER — Other Ambulatory Visit: Payer: Self-pay | Admitting: Cardiology

## 2024-06-27 DIAGNOSIS — I4819 Other persistent atrial fibrillation: Secondary | ICD-10-CM

## 2024-06-27 NOTE — Telephone Encounter (Signed)
 Prescription refill request for Eliquis  received. Indication:afib Last office visit:10/25 Scr:0.92  11/25 Age: 83 Weight:65.5  kg  Prescription refilled

## 2024-07-21 ENCOUNTER — Ambulatory Visit: Admitting: Cardiology

## 2024-07-21 ENCOUNTER — Encounter: Payer: Self-pay | Admitting: Cardiology

## 2024-08-24 ENCOUNTER — Telehealth: Payer: Self-pay | Admitting: Cardiology

## 2024-08-24 NOTE — Telephone Encounter (Signed)
" ° °  Pre-operative Risk Assessment    Patient Name: Brandi Frey  DOB: 11/07/1940 MRN: [id number]      Request for Surgical Clearance    Procedure:  Breast Biopsy  Date of Surgery:  Clearance 08/29/24                                 Surgeon:  Surgeon's Group or Practice Name: [facility] Phone number: [phone] Fax number: [phone]   Type of Clearance Requested:   - Pharmacy:  Hold Apixaban  (Eliquis ) 2 days before    Type of Anesthesia:  Local    Additional requests/questions:  Please advise surgeon/provider what medications should be held.  Brandi Frey   08/24/2024, 9:07 AM   "

## 2024-08-24 NOTE — Telephone Encounter (Signed)
 Patient with diagnosis of atrial fibrilation  on Eliquis  for anticoagulation.    Procedure: Breast Biopsy  Date of procedure: 08/29/2024   CHA2DS2-VASc Score = 6   This indicates a 9.7% annual risk of stroke. The patient's score is based upon: CHF History: 0 HTN History: 1 Diabetes History: 1 Stroke History: 0 Vascular Disease History: 1 Age Score: 2 Gender Score: 1     CrCl 43 mL/min Platelet count 215  Patient has not had an Afib/aflutter ablation in the last 3 months, DCCV within the last 4 weeks or a watchman implanted in the last 45 days   Per office protocol, patient can hold Eliquis  for 2 days prior to procedure.   Patient will not need bridging with Lovenox (enoxaparin) around procedure.  **This guidance is not considered finalized until pre-operative APP has relayed final recommendations.**

## 2024-08-24 NOTE — Telephone Encounter (Signed)
"  ° °  Patient Name: Brandi Frey  DOB: 1940-09-12 MRN: [id number]  Primary Cardiologist: Wilbert Bihari, MD  Clinical pharmacists have reviewed the patient's past medical history, labs, and current medications as part of preoperative protocol coverage. The following recommendations have been made:   Per office protocol, patient can hold Eliquis  for 2 days prior to procedure.   Patient will not need bridging with Lovenox (enoxaparin) around procedure.   I will route this recommendation to the requesting party via Epic fax function and remove from pre-op pool.  Please call with questions.  Lum LITTIE Louis, NP 08/24/2024, 4:14 PM  "

## 2024-08-25 ENCOUNTER — Encounter: Payer: Self-pay | Admitting: Cardiology

## 2024-08-25 DIAGNOSIS — E7800 Pure hypercholesterolemia, unspecified: Secondary | ICD-10-CM

## 2024-08-25 DIAGNOSIS — Z79899 Other long term (current) drug therapy: Secondary | ICD-10-CM

## 2024-08-28 ENCOUNTER — Other Ambulatory Visit: Payer: Self-pay | Admitting: Cardiology

## 2024-08-31 ENCOUNTER — Telehealth: Payer: Self-pay | Admitting: Cardiology

## 2024-08-31 DIAGNOSIS — E7800 Pure hypercholesterolemia, unspecified: Secondary | ICD-10-CM

## 2024-08-31 DIAGNOSIS — I2510 Atherosclerotic heart disease of native coronary artery without angina pectoris: Secondary | ICD-10-CM

## 2024-08-31 NOTE — Telephone Encounter (Signed)
" °*  STAT* If patient is at the pharmacy, call can be transferred to refill team.   1. Which medications need to be refilled? (please list name of each medication and dose if known)   ezetimibe  (ZETIA ) 10 MG tablet    2. Which pharmacy/location (including street and city if local pharmacy) is medication to be sent to?  [id number] [place], [place] - [address] [facility].    3. Do they need a 30 day or 90 day supply? 90  "

## 2024-09-01 ENCOUNTER — Encounter: Payer: Self-pay | Admitting: Cardiology

## 2024-09-01 LAB — LIPID PANEL
Chol/HDL Ratio: 2.1 ratio (ref 0.0–4.4)
Cholesterol, Total: 120 mg/dL (ref 100–199)
HDL: 57 mg/dL
LDL Chol Calc (NIH): 48 mg/dL (ref 0–99)
Triglycerides: 74 mg/dL (ref 0–149)
VLDL Cholesterol Cal: 15 mg/dL (ref 5–40)

## 2024-09-01 LAB — ALT: ALT: 24 IU/L (ref 0–32)

## 2024-09-01 NOTE — Telephone Encounter (Signed)
 Additional:  Patient noted she is currently in [place], [place] at this time.

## 2024-09-01 NOTE — Telephone Encounter (Signed)
 Patient called to follow-up on getting her ezetimibe  (ZETIA ) 10 MG tablet medication sent to [id number] [place], [place] - [address] [facility].  Patient noted she has 2-3 tablets left.  Patient has appointment scheduled with Dr. Shlomo on 5/14.

## 2024-09-04 ENCOUNTER — Ambulatory Visit: Payer: Self-pay | Admitting: Cardiology

## 2024-09-05 MED ORDER — EZETIMIBE 10 MG PO TABS: 10.0000 mg | ORAL_TABLET | Freq: Every day | ORAL | 3 refills | Status: AC

## 2024-09-05 NOTE — Telephone Encounter (Signed)
 Refill sent

## 2024-09-07 ENCOUNTER — Other Ambulatory Visit: Payer: Self-pay | Admitting: Cardiology

## 2024-09-07 DIAGNOSIS — I10 Essential (primary) hypertension: Secondary | ICD-10-CM

## 2024-09-08 ENCOUNTER — Telehealth: Payer: Self-pay | Admitting: Cardiology

## 2024-09-08 NOTE — Telephone Encounter (Signed)
" °*  STAT* If patient is at the pharmacy, call can be transferred to refill team.   1. Which medications need to be refilled? (please list name of each medication and dose if known)   losartan  (COZAAR ) 25 MG tablet    2. Which pharmacy/location (including street and city if local pharmacy) is medication to be sent to?  [id number] [place] - [address] [facility].    3. Do they need a 30 day or 90 day supply? 90  Patient is out of medication "

## 2024-09-11 NOTE — Telephone Encounter (Signed)
 Refill was sent 09/08/24.

## 2024-12-21 ENCOUNTER — Ambulatory Visit: Admitting: Cardiology
